# Patient Record
Sex: Female | Born: 1950 | Race: White | Hispanic: No | Smoking: Never smoker
Health system: Southern US, Community
[De-identification: ages and names within clinical notes are randomized; demographics above are authoritative.]

## PROBLEM LIST (undated history)

## (undated) DIAGNOSIS — M2550 Pain in unspecified joint: Secondary | ICD-10-CM

## (undated) DIAGNOSIS — K859 Acute pancreatitis without necrosis or infection, unspecified: Secondary | ICD-10-CM

## (undated) DIAGNOSIS — M549 Dorsalgia, unspecified: Secondary | ICD-10-CM

## (undated) DIAGNOSIS — T783XXA Angioneurotic edema, initial encounter: Secondary | ICD-10-CM

## (undated) DIAGNOSIS — M5124 Other intervertebral disc displacement, thoracic region: Secondary | ICD-10-CM

## (undated) DIAGNOSIS — L509 Urticaria, unspecified: Secondary | ICD-10-CM

## (undated) DIAGNOSIS — M4800 Spinal stenosis, site unspecified: Secondary | ICD-10-CM

## (undated) DIAGNOSIS — Z91018 Allergy to other foods: Secondary | ICD-10-CM

## (undated) DIAGNOSIS — G589 Mononeuropathy, unspecified: Secondary | ICD-10-CM

## (undated) DIAGNOSIS — K219 Gastro-esophageal reflux disease without esophagitis: Secondary | ICD-10-CM

## (undated) DIAGNOSIS — N399 Disorder of urinary system, unspecified: Secondary | ICD-10-CM

## (undated) DIAGNOSIS — R000 Tachycardia, unspecified: Secondary | ICD-10-CM

## (undated) DIAGNOSIS — K589 Irritable bowel syndrome without diarrhea: Secondary | ICD-10-CM

## (undated) DIAGNOSIS — K449 Diaphragmatic hernia without obstruction or gangrene: Secondary | ICD-10-CM

## (undated) DIAGNOSIS — G4730 Sleep apnea, unspecified: Secondary | ICD-10-CM

## (undated) DIAGNOSIS — I10 Essential (primary) hypertension: Secondary | ICD-10-CM

## (undated) DIAGNOSIS — R1310 Dysphagia, unspecified: Secondary | ICD-10-CM

## (undated) DIAGNOSIS — K635 Polyp of colon: Secondary | ICD-10-CM

## (undated) DIAGNOSIS — K5900 Constipation, unspecified: Secondary | ICD-10-CM

## (undated) DIAGNOSIS — E739 Lactose intolerance, unspecified: Secondary | ICD-10-CM

## (undated) DIAGNOSIS — Z9889 Other specified postprocedural states: Secondary | ICD-10-CM

## (undated) DIAGNOSIS — E559 Vitamin D deficiency, unspecified: Secondary | ICD-10-CM

## (undated) DIAGNOSIS — R112 Nausea with vomiting, unspecified: Secondary | ICD-10-CM

## (undated) DIAGNOSIS — N200 Calculus of kidney: Secondary | ICD-10-CM

## (undated) DIAGNOSIS — J45909 Unspecified asthma, uncomplicated: Secondary | ICD-10-CM

## (undated) HISTORY — PX: NASAL SINUS SURGERY: SHX719

## (undated) HISTORY — DX: Diaphragmatic hernia without obstruction or gangrene: K44.9

## (undated) HISTORY — DX: Polyp of colon: K63.5

## (undated) HISTORY — DX: Dorsalgia, unspecified: M54.9

## (undated) HISTORY — PX: FOOT SURGERY: SHX648

## (undated) HISTORY — DX: Tachycardia, unspecified: R00.0

## (undated) HISTORY — DX: Unspecified asthma, uncomplicated: J45.909

## (undated) HISTORY — DX: Vitamin D deficiency, unspecified: E55.9

## (undated) HISTORY — DX: Irritable bowel syndrome, unspecified: K58.9

## (undated) HISTORY — DX: Other intervertebral disc displacement, thoracic region: M51.24

## (undated) HISTORY — PX: ROTATOR CUFF REPAIR: SHX139

## (undated) HISTORY — DX: Essential (primary) hypertension: I10

## (undated) HISTORY — DX: Angioneurotic edema, initial encounter: T78.3XXA

## (undated) HISTORY — DX: Urticaria, unspecified: L50.9

## (undated) HISTORY — PX: ABDOMINAL HYSTERECTOMY: SHX81

## (undated) HISTORY — DX: Gastro-esophageal reflux disease without esophagitis: K21.9

## (undated) HISTORY — DX: Disorder of urinary system, unspecified: N39.9

## (undated) HISTORY — DX: Mononeuropathy, unspecified: G58.9

## (undated) HISTORY — DX: Pain in unspecified joint: M25.50

## (undated) HISTORY — DX: Dysphagia, unspecified: R13.10

## (undated) HISTORY — DX: Lactose intolerance, unspecified: E73.9

## (undated) HISTORY — PX: CYST REMOVAL HAND: SHX6279

## (undated) HISTORY — DX: Spinal stenosis, site unspecified: M48.00

## (undated) HISTORY — DX: Allergy to other foods: Z91.018

---

## 1977-03-17 HISTORY — PX: MANDIBLE FRACTURE SURGERY: SHX706

## 1996-03-17 HISTORY — PX: ABDOMINAL HYSTERECTOMY: SHX81

## 1996-03-17 HISTORY — PX: BLADDER SURGERY: SHX569

## 1997-08-08 ENCOUNTER — Ambulatory Visit (HOSPITAL_BASED_OUTPATIENT_CLINIC_OR_DEPARTMENT_OTHER): Admission: RE | Admit: 1997-08-08 | Discharge: 1997-08-08 | Payer: Self-pay | Admitting: Orthopedic Surgery

## 1998-01-31 ENCOUNTER — Encounter: Payer: Self-pay | Admitting: Gynecology

## 1998-01-31 ENCOUNTER — Ambulatory Visit (HOSPITAL_COMMUNITY): Admission: RE | Admit: 1998-01-31 | Discharge: 1998-01-31 | Payer: Self-pay | Admitting: Gynecology

## 1999-02-06 ENCOUNTER — Ambulatory Visit (HOSPITAL_COMMUNITY): Admission: RE | Admit: 1999-02-06 | Discharge: 1999-02-06 | Payer: Self-pay | Admitting: Psychiatry

## 1999-02-06 ENCOUNTER — Encounter: Payer: Self-pay | Admitting: Family Medicine

## 2000-02-11 ENCOUNTER — Encounter: Payer: Self-pay | Admitting: Family Medicine

## 2000-02-11 ENCOUNTER — Ambulatory Visit (HOSPITAL_COMMUNITY): Admission: RE | Admit: 2000-02-11 | Discharge: 2000-02-11 | Payer: Self-pay | Admitting: Family Medicine

## 2000-04-22 ENCOUNTER — Encounter: Payer: Self-pay | Admitting: Family Medicine

## 2000-04-22 ENCOUNTER — Encounter: Admission: RE | Admit: 2000-04-22 | Discharge: 2000-04-22 | Payer: Self-pay | Admitting: Family Medicine

## 2000-06-08 ENCOUNTER — Ambulatory Visit (HOSPITAL_COMMUNITY): Admission: RE | Admit: 2000-06-08 | Discharge: 2000-06-08 | Payer: Self-pay | Admitting: Gastroenterology

## 2000-06-08 ENCOUNTER — Encounter: Payer: Self-pay | Admitting: Gastroenterology

## 2000-06-09 ENCOUNTER — Encounter: Payer: Self-pay | Admitting: Gastroenterology

## 2000-06-09 ENCOUNTER — Ambulatory Visit (HOSPITAL_COMMUNITY): Admission: RE | Admit: 2000-06-09 | Discharge: 2000-06-09 | Payer: Self-pay | Admitting: Gastroenterology

## 2000-08-27 ENCOUNTER — Emergency Department (HOSPITAL_COMMUNITY): Admission: EM | Admit: 2000-08-27 | Discharge: 2000-08-28 | Payer: Self-pay | Admitting: Emergency Medicine

## 2001-05-20 ENCOUNTER — Other Ambulatory Visit: Admission: RE | Admit: 2001-05-20 | Discharge: 2001-05-20 | Payer: Self-pay | Admitting: Obstetrics and Gynecology

## 2001-06-11 ENCOUNTER — Ambulatory Visit (HOSPITAL_COMMUNITY): Admission: RE | Admit: 2001-06-11 | Discharge: 2001-06-11 | Payer: Self-pay | Admitting: Obstetrics and Gynecology

## 2001-06-11 ENCOUNTER — Encounter: Payer: Self-pay | Admitting: Obstetrics and Gynecology

## 2002-05-26 ENCOUNTER — Other Ambulatory Visit: Admission: RE | Admit: 2002-05-26 | Discharge: 2002-05-26 | Payer: Self-pay | Admitting: Obstetrics and Gynecology

## 2002-06-14 ENCOUNTER — Encounter: Payer: Self-pay | Admitting: Obstetrics and Gynecology

## 2002-06-14 ENCOUNTER — Ambulatory Visit (HOSPITAL_COMMUNITY): Admission: RE | Admit: 2002-06-14 | Discharge: 2002-06-14 | Payer: Self-pay | Admitting: Obstetrics and Gynecology

## 2002-07-22 ENCOUNTER — Encounter: Payer: Self-pay | Admitting: Family Medicine

## 2002-07-22 ENCOUNTER — Encounter: Admission: RE | Admit: 2002-07-22 | Discharge: 2002-07-22 | Payer: Self-pay | Admitting: Family Medicine

## 2003-03-24 ENCOUNTER — Encounter: Admission: RE | Admit: 2003-03-24 | Discharge: 2003-03-24 | Payer: Self-pay | Admitting: Family Medicine

## 2003-07-11 ENCOUNTER — Ambulatory Visit (HOSPITAL_COMMUNITY): Admission: RE | Admit: 2003-07-11 | Discharge: 2003-07-11 | Payer: Self-pay | Admitting: Obstetrics and Gynecology

## 2003-07-25 ENCOUNTER — Emergency Department (HOSPITAL_COMMUNITY): Admission: EM | Admit: 2003-07-25 | Discharge: 2003-07-25 | Payer: Self-pay | Admitting: Family Medicine

## 2003-08-02 ENCOUNTER — Encounter: Admission: RE | Admit: 2003-08-02 | Discharge: 2003-08-02 | Payer: Self-pay | Admitting: Allergy and Immunology

## 2004-08-08 ENCOUNTER — Ambulatory Visit (HOSPITAL_COMMUNITY): Admission: RE | Admit: 2004-08-08 | Discharge: 2004-08-08 | Payer: Self-pay | Admitting: Obstetrics and Gynecology

## 2005-04-21 ENCOUNTER — Emergency Department (HOSPITAL_COMMUNITY): Admission: EM | Admit: 2005-04-21 | Discharge: 2005-04-21 | Payer: Self-pay | Admitting: Family Medicine

## 2005-05-05 ENCOUNTER — Emergency Department (HOSPITAL_COMMUNITY): Admission: EM | Admit: 2005-05-05 | Discharge: 2005-05-05 | Payer: Self-pay | Admitting: Family Medicine

## 2006-01-05 ENCOUNTER — Ambulatory Visit (HOSPITAL_COMMUNITY): Admission: RE | Admit: 2006-01-05 | Discharge: 2006-01-05 | Payer: Self-pay | Admitting: Internal Medicine

## 2006-08-04 ENCOUNTER — Ambulatory Visit (HOSPITAL_COMMUNITY): Admission: RE | Admit: 2006-08-04 | Discharge: 2006-08-04 | Payer: Self-pay | Admitting: *Deleted

## 2006-08-04 ENCOUNTER — Encounter (INDEPENDENT_AMBULATORY_CARE_PROVIDER_SITE_OTHER): Payer: Self-pay | Admitting: *Deleted

## 2006-09-02 ENCOUNTER — Encounter: Admission: RE | Admit: 2006-09-02 | Discharge: 2006-09-02 | Payer: Self-pay | Admitting: *Deleted

## 2007-01-07 ENCOUNTER — Ambulatory Visit (HOSPITAL_COMMUNITY): Admission: RE | Admit: 2007-01-07 | Discharge: 2007-01-07 | Payer: Self-pay | Admitting: Internal Medicine

## 2007-06-09 ENCOUNTER — Ambulatory Visit (HOSPITAL_COMMUNITY): Admission: RE | Admit: 2007-06-09 | Discharge: 2007-06-09 | Payer: Self-pay | Admitting: Internal Medicine

## 2008-02-28 ENCOUNTER — Ambulatory Visit (HOSPITAL_COMMUNITY): Admission: RE | Admit: 2008-02-28 | Discharge: 2008-02-28 | Payer: Self-pay | Admitting: Internal Medicine

## 2008-07-10 ENCOUNTER — Encounter (INDEPENDENT_AMBULATORY_CARE_PROVIDER_SITE_OTHER): Payer: Self-pay | Admitting: *Deleted

## 2008-07-10 IMAGING — CT CT ABDOMEN WO/W CM
3 of 6 series · 15 of 46 positions shown, 17 images · IV contrast (READICAT/WATER)
Comparison: none

CLINICAL DATA: Abdominal and pelvic pain. 
 ABDOMEN CT WITHOUT AND WITH CONTRAST:
TECHNIQUE: Multidetector CT imaging of the abdomen was performed both before and during bolus administration of intravenous contrast.
 Contrast:  125 cc Omnipaque 300
 No comparison.
TECHNIQUE: Multidetector CT imaging of the pelvis was performed both before and during bolus administration of intravenous contrast.

[Series 4: routine abdomen · axial · 0.74mm/px · z∈[-426,-66]mm · 10 of 88 slices shown, 12 images]
[im 8/88  soft-tissue]
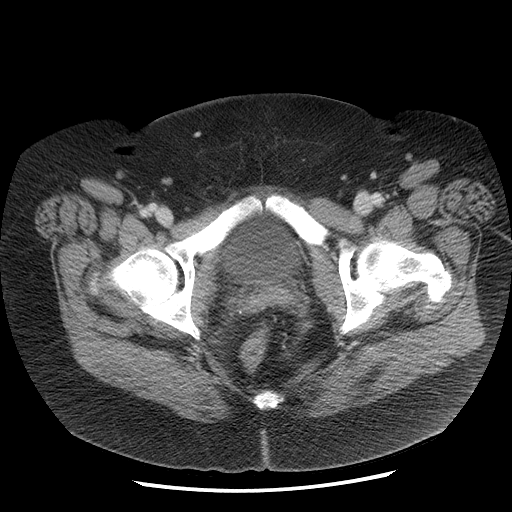
[im 8/88  bone]
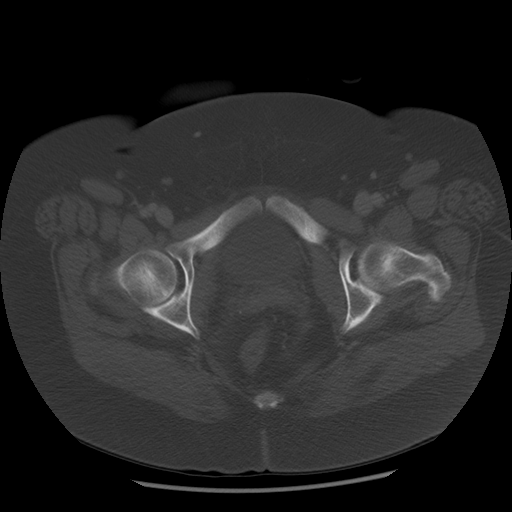
[im 16/88  soft-tissue]
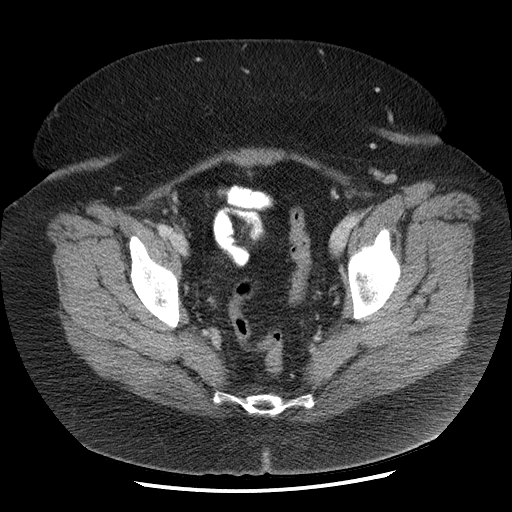
[im 24/88  soft-tissue]
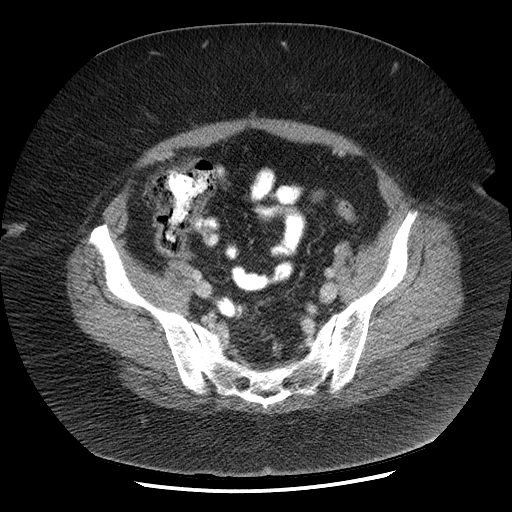
[im 32/88  soft-tissue]
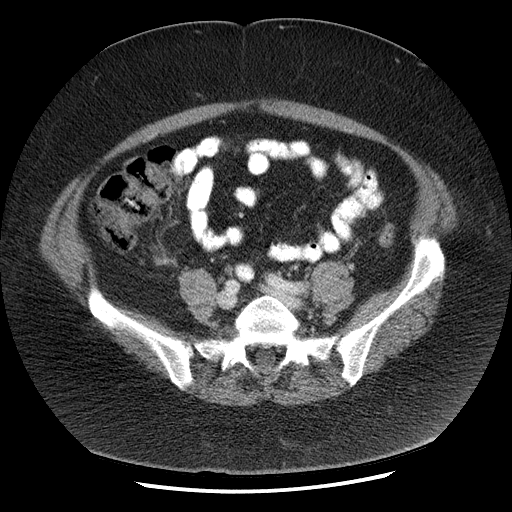
[im 40/88  soft-tissue]
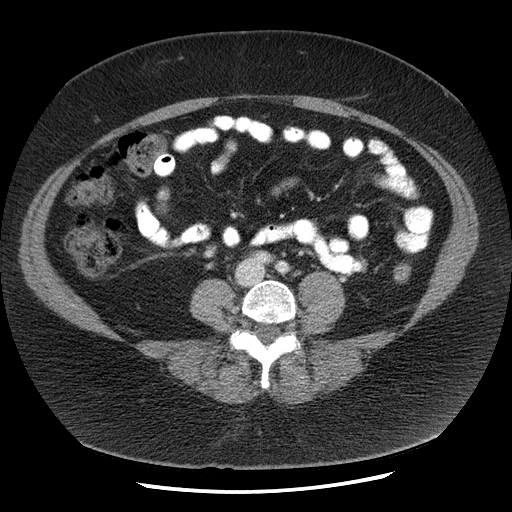
[im 48/88  soft-tissue]
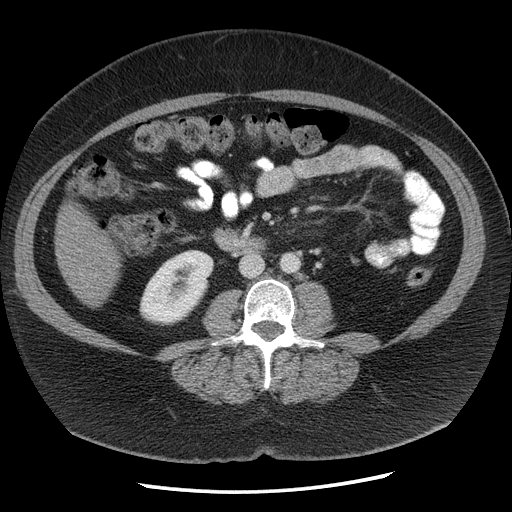
[im 56/88  soft-tissue]
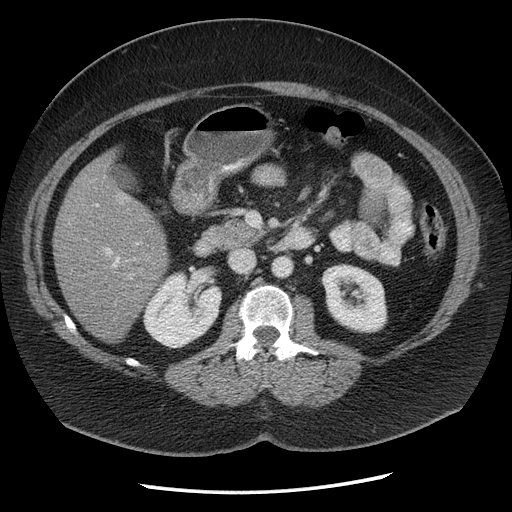
[im 64/88  soft-tissue]
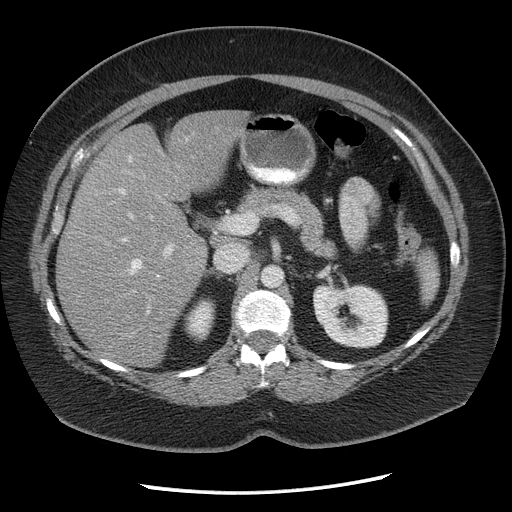
[im 72/88  soft-tissue]
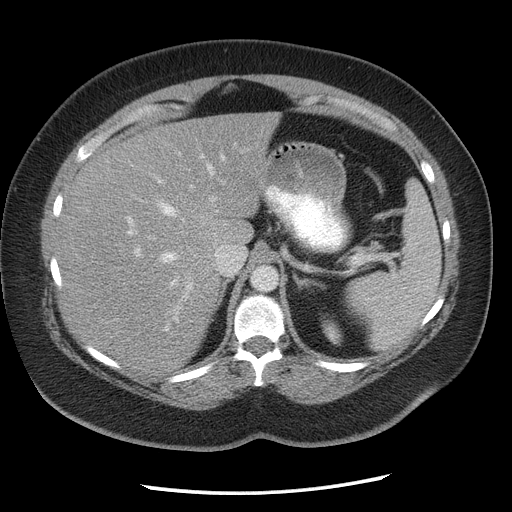
[im 72/88  bone]
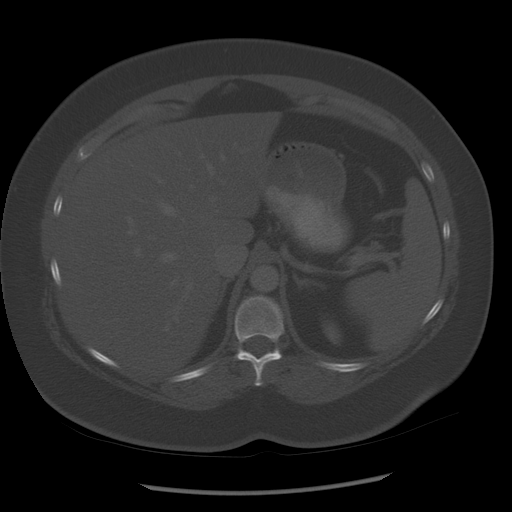
[im 80/88  soft-tissue]
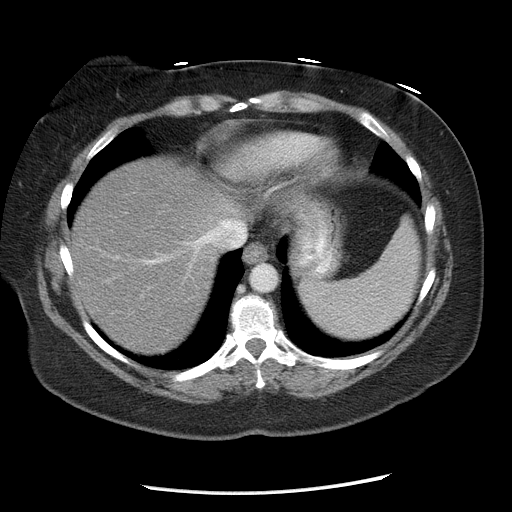

[Series 5: lung windows · axial · 0.66mm/px · z∈[-116,-71]mm · 2 of 27 slices shown]
[im 9/27  bone]
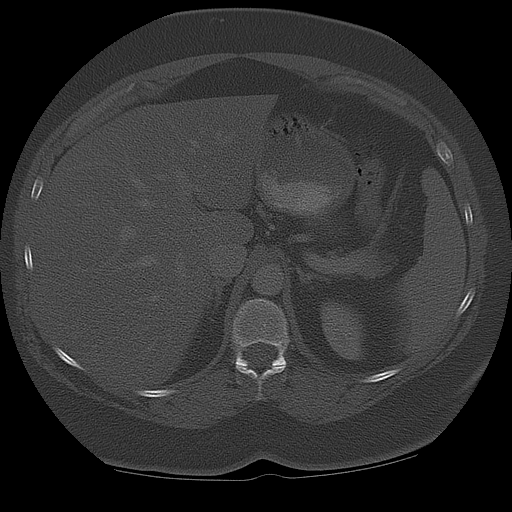
[im 18/27  bone]
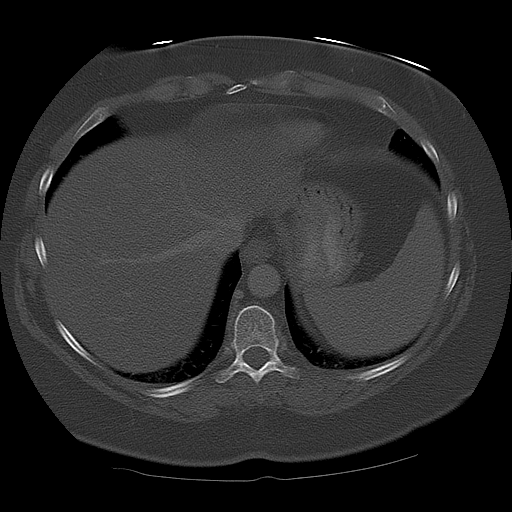

[Series 602: sagittal body · sagittal · 0.89mm/px · 3 of 157 slices shown]
[im 53/157  soft-tissue]
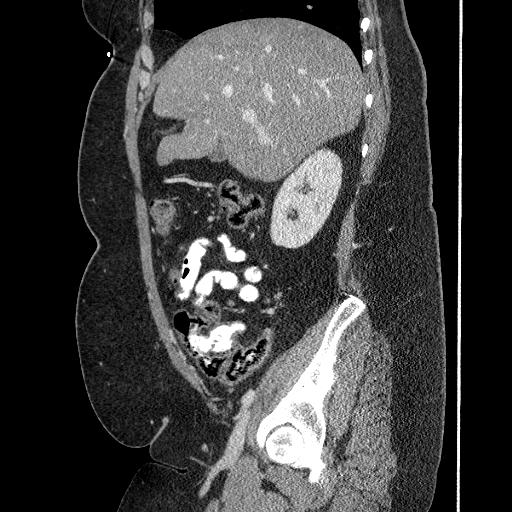
[im 70/157  soft-tissue]
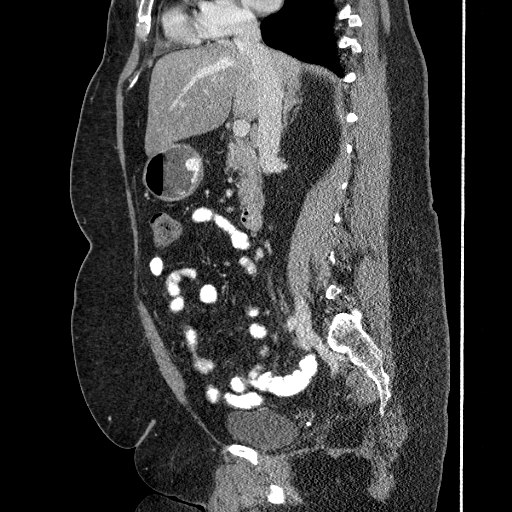
[im 87/157  soft-tissue]
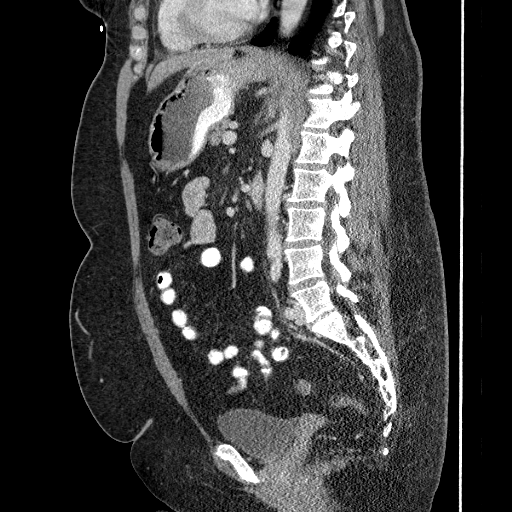

[15 of 46 positions shown; findings below may reference images not displayed]

FINDINGS: The lung bases are clear.  The unenhanced study shows no renal calculi.  No calcified gallstones are noted.  After contrast administration, the liver enhances with no focal abnormality.  The liver is somewhat low in attenuation and mild fatty infiltration is a consideration.  No ductal dilatation is seen.  The gallbladder is well-visualized and no gallstones are noted.  The pancreas is normal in size and the pancreatic duct is not dilated.  The adrenal glands and spleen appear normal.  The kidneys enhance and on delayed images, the pelvocaliceal systems appear normal.  The abdominal aorta is normal in caliber.
IMPRESSION: No acute abnormality on CT of the abdomen.  There is suggestion of diffuse fatty infiltration of the liver. 
 PELVIS CT WITH CONTRAST:
FINDINGS: The ureters are normal in caliber on the unenhanced study, and no distal ureteral calculi are seen.  The urinary bladder is unremarkable.  The patient has previously undergone hysterectomy.  No adnexal lesion is seen.  The terminal ileum appears normal.   No fluid is seen within the pelvis.
IMPRESSION: Negative CT of the pelvis.

## 2009-05-22 ENCOUNTER — Ambulatory Visit (HOSPITAL_COMMUNITY): Admission: RE | Admit: 2009-05-22 | Discharge: 2009-05-22 | Payer: Self-pay | Admitting: Internal Medicine

## 2010-05-27 ENCOUNTER — Other Ambulatory Visit (HOSPITAL_COMMUNITY): Payer: Self-pay | Admitting: Internal Medicine

## 2010-05-27 ENCOUNTER — Ambulatory Visit (HOSPITAL_COMMUNITY)
Admission: RE | Admit: 2010-05-27 | Discharge: 2010-05-27 | Disposition: A | Discharge status: Home / Self Care | Payer: BC Managed Care – PPO | Source: Ambulatory Visit | Attending: Internal Medicine | Admitting: Internal Medicine

## 2010-05-27 DIAGNOSIS — Z1231 Encounter for screening mammogram for malignant neoplasm of breast: Secondary | ICD-10-CM

## 2010-07-30 NOTE — Op Note (Signed)
[name], Anna Francis           ACCOUNT NO.:  [id number]   MEDICAL RECORD NO.:  [id number]          PATIENT TYPE:  AMB   LOCATION:  ENDO                         FACILITY:  [facility]   PHYSICIAN:  Georgiana Spinner, M.D.    DATE OF BIRTH:  May 25, 1950   DATE OF PROCEDURE:  08/04/2006  DATE OF DISCHARGE:                               OPERATIVE REPORT   PROCEDURE:  Colonoscopy.   INDICATIONS:  Colon polyp, colon cancer screening and abdominal pain.   ANESTHESIA:  Demerol 60 mg, Versed 4 mg.   PROCEDURE:  With the patient mildly sedated in the left lateral  decubitus position, the Pentax videoscopic colonoscope was inserted in  the rectum and passed under direct vision to the sigmoid colon, and I  could not get to go past an area that appeared somewhat tight at that  point, so I elected to withdraw the colonoscope and insert instead the  Pentax videoscopic pediatric colonoscope.  With pressure applied and the  patient turned subsequently to her back, we were able to advance the  endoscope to the cecum identified by ileocecal valve and base of cecum,  both of which were photographed.  From this point, the colonoscope was  slowly withdrawn, taking circumferential views of colonic mucosa,  stopping only at 25 cm from anal verge at which point a polyp was seen,  photographed and removed using hot biopsy forceps technique - setting of  20/200 blended current.  We pulled back to the rectum which appeared  normal on direct and showed hemorrhoids on retroflexed view.  The  endoscope was straightened and withdrawn.  The patient's vital signs and  pulse oximeter remained stable.  The patient tolerated the procedure  well without apparent complications.   FINDINGS:  Internal hemorrhoids.  Polyp at 25 cm from anal verge with  some tightening of either a stricture formation or a turning of the  colon which would preclude passage of a regular colonoscope - this in  the sigmoid colon area.   PLAN:  The  patient will call me for results of biopsy and follow-up with  me as an outpatient.           ______________________________  Georgiana Spinner, M.D.     GMO/MEDQ  D:  08/04/2006  T:  08/04/2006  Job:  [id number]

## 2010-07-30 NOTE — Op Note (Signed)
NAMESARABI, SOCKWELL           ACCOUNT NO.:  [id number]   MEDICAL RECORD NO.:  [id number]          PATIENT TYPE:  AMB   LOCATION:  ENDO                         FACILITY:  [facility]   PHYSICIAN:  Georgiana Spinner, M.D.    DATE OF BIRTH:  25-Nov-1950   DATE OF PROCEDURE:  DATE OF DISCHARGE:                               OPERATIVE REPORT   PROCEDURE:  Upper endoscopy.   INDICATIONS:  Abdominal pain.   ANESTHESIA:  Demerol 40 mg, Versed 5 mg.   PROCEDURE:  With the patient mildly sedated in the left lateral  decubitus position, the Pentax videoscopic endoscope was inserted in the  mouth and passed under direct vision through the esophagus which  appeared normal until we reached distal esophagus, and there were  questionable changes of Barrett's photographed and biopsied.  We then  entered into the stomach.  The fundus, body, antrum, duodenal bulb, and  second portion of the duodenum appeared normal.  From this point the  endoscope was slowly withdrawn, taking circumferential views of the  duodenal mucosa until the endoscope had been pulled back into the  stomach, placed in retroflexion to view the stomach from below.  The  endoscope was straightened and withdrawn, taking circumferential views  of the remaining gastric and esophageal mucosa after biopsying a fundic  gland polyp.  The patient's vital signs and pulse oximeter remained  stable.  The patient tolerated the procedure well without apparent  complications.   FINDINGS:  Polyp in the fundus, probably a fundic gland polyp.  Question  of Barrett's esophagus, biopsied.  Await biopsy report.  The patient  will call me for results and follow up with me as an outpatient.  Proceed to colonoscopy as planned.           ______________________________  Georgiana Spinner, M.D.     GMO/MEDQ  D:  08/04/2006  T:  08/04/2006  Job:  [id number]

## 2011-05-16 ENCOUNTER — Other Ambulatory Visit (HOSPITAL_COMMUNITY): Payer: Self-pay | Admitting: Internal Medicine

## 2011-05-16 DIAGNOSIS — Z1231 Encounter for screening mammogram for malignant neoplasm of breast: Secondary | ICD-10-CM

## 2011-05-19 ENCOUNTER — Emergency Department (HOSPITAL_COMMUNITY)
Admission: EM | Admit: 2011-05-19 | Discharge: 2011-05-19 | Disposition: A | Discharge status: Home / Self Care | Payer: BC Managed Care – PPO | Attending: Emergency Medicine | Admitting: Emergency Medicine

## 2011-05-19 ENCOUNTER — Emergency Department (INDEPENDENT_AMBULATORY_CARE_PROVIDER_SITE_OTHER)
Admission: EM | Admit: 2011-05-19 | Discharge: 2011-05-19 | Disposition: A | Discharge status: Nursing Home (Includes ICF) | Payer: BC Managed Care – PPO | Source: Home / Self Care

## 2011-05-19 ENCOUNTER — Emergency Department (HOSPITAL_COMMUNITY): Payer: BC Managed Care – PPO

## 2011-05-19 ENCOUNTER — Emergency Department (INDEPENDENT_AMBULATORY_CARE_PROVIDER_SITE_OTHER): Payer: BC Managed Care – PPO

## 2011-05-19 ENCOUNTER — Encounter (HOSPITAL_COMMUNITY): Payer: Self-pay | Admitting: Emergency Medicine

## 2011-05-19 ENCOUNTER — Encounter (HOSPITAL_COMMUNITY): Payer: Self-pay

## 2011-05-19 DIAGNOSIS — R109 Unspecified abdominal pain: Secondary | ICD-10-CM

## 2011-05-19 DIAGNOSIS — K219 Gastro-esophageal reflux disease without esophagitis: Secondary | ICD-10-CM | POA: Insufficient documentation

## 2011-05-19 DIAGNOSIS — R3129 Other microscopic hematuria: Secondary | ICD-10-CM

## 2011-05-19 DIAGNOSIS — R1012 Left upper quadrant pain: Secondary | ICD-10-CM | POA: Insufficient documentation

## 2011-05-19 HISTORY — DX: Calculus of kidney: N20.0

## 2011-05-19 HISTORY — DX: Gastro-esophageal reflux disease without esophagitis: K21.9

## 2011-05-19 LAB — DIFFERENTIAL
Basophils Absolute: 0 10*3/uL (ref 0.0–0.1)
Eosinophils Relative: 2 % (ref 0–5)
Lymphocytes Relative: 35 % (ref 12–46)
Lymphs Abs: 3.2 10*3/uL (ref 0.7–4.0)
Monocytes Absolute: 0.6 10*3/uL (ref 0.1–1.0)
Monocytes Relative: 7 % (ref 3–12)
Neutro Abs: 5.3 10*3/uL (ref 1.7–7.7)

## 2011-05-19 LAB — POCT URINALYSIS DIP (DEVICE)
Glucose, UA: NEGATIVE mg/dL
Ketones, ur: NEGATIVE mg/dL
Specific Gravity, Urine: <=1.005 (ref 1.005–1.030)
Urobilinogen, UA: 0.2 mg/dL (ref 0.0–1.0)

## 2011-05-19 LAB — COMPREHENSIVE METABOLIC PANEL
AST: 31 U/L (ref 0–37)
CO2: 29 mEq/L (ref 19–32)
Calcium: 10.1 mg/dL (ref 8.4–10.5)
Chloride: 101 mEq/L (ref 96–112)
Creatinine, Ser: 0.73 mg/dL (ref 0.50–1.10)
GFR calc Af Amer: >90 mL/min (ref >90)
GFR calc non Af Amer: >90 mL/min (ref >90)
Glucose, Bld: 92 mg/dL (ref 70–99)
Total Bilirubin: 0.5 mg/dL (ref 0.3–1.2)

## 2011-05-19 LAB — LIPASE, BLOOD: Lipase: 37 U/L (ref 11–59)

## 2011-05-19 LAB — CBC
HCT: 40.8 % (ref 36.0–46.0)
Hemoglobin: 13.7 g/dL (ref 12.0–15.0)
MCV: 90.3 fL (ref 78.0–100.0)
RBC: 4.52 MIL/uL (ref 3.87–5.11)
RDW: 13.2 % (ref 11.5–15.5)
WBC: 9.4 10*3/uL (ref 4.0–10.5)

## 2011-05-19 LAB — URINALYSIS, ROUTINE W REFLEX MICROSCOPIC
Glucose, UA: NEGATIVE mg/dL
Leukocytes, UA: NEGATIVE
Protein, ur: NEGATIVE mg/dL
pH: 5.5 (ref 5.0–8.0)

## 2011-05-19 LAB — URINE MICROSCOPIC-ADD ON

## 2011-05-19 MED ORDER — HYDROCODONE-ACETAMINOPHEN 5-325 MG PO TABS: 1.0000 | ORAL_TABLET | Freq: Four times a day (QID) | ORAL | Status: AC | PRN

## 2011-05-19 MED ORDER — ACETAMINOPHEN 325 MG PO TABS
650.0000 mg | ORAL_TABLET | Freq: Once | ORAL | Status: AC
  Administered 2011-05-19: 650 mg via ORAL
  Filled 2011-05-19: qty 2

## 2011-05-19 NOTE — ED Notes (Signed)
Pt alert, NAD, calm, interactive, skin W&D, resps e/u, rates pain 810, "feels about the same, to CT.

## 2011-05-19 NOTE — ED Provider Notes (Signed)
History     CSN: [id number]  Arrival date & time 05/19/11  1056   None     Chief Complaint  Patient presents with  . Flank Pain  . Rash    (Consider location/radiation/quality/duration/timing/severity/associated sxs/prior treatment) HPI Comments: Jania presents today with complaints of left flank pain. Pain began yesterday and she states that it at times is severe, but does not radiate.  Her bowel movements have been normal, been more frequent recently due to any change in her diet. She did try a suppository yesterday thinking that she had a bowel movement it may help, but did not have any improvement after passing only mucus. No fever, chills, nausea or vomiting. She has been urinating normally and denies gross hematuria or dysuria. She has a hx of kidney stones many yrs ago.    Past Medical History  Diagnosis Date  . GERD (gastroesophageal reflux disease)   . Kidney stone     Past Surgical History  Procedure Date  . Abdominal hysterectomy   . Nasal sinus surgery     History reviewed. No pertinent family history.  History  Substance Use Topics  . Smoking status: Never Smoker   . Smokeless tobacco: Not on file  . Alcohol Use: No    OB History    Grav Para Term Preterm Abortions TAB SAB Ect Mult Living                  Review of Systems  Constitutional: Negative for fever and chills.  Gastrointestinal: Negative for nausea, vomiting, abdominal pain, diarrhea, constipation and abdominal distention.  Genitourinary: Positive for flank pain. Negative for dysuria, hematuria and decreased urine volume.  Skin: Positive for rash.    Allergies  Lorabid and Penicillins  Home Medications   Current Outpatient Rx  Name Route Sig Dispense Refill  . FLONASE NA Nasal Place into the nose.    Marland Kitchen PREVACID PO Oral Take by mouth.    . CENTRUM SILVER PO Oral Take by mouth.      BP 147/94  Pulse 76  Temp(Src) 97.8 F (36.6 C) (Oral)  Resp 16  SpO2 100%  Physical Exam    Nursing note and vitals reviewed. Constitutional: She appears well-developed and well-nourished. No distress.  HENT:  Head: Normocephalic and atraumatic.  Cardiovascular: Normal rate, regular rhythm and normal heart sounds.   Pulmonary/Chest: Effort normal and breath sounds normal. No respiratory distress.  Abdominal: Soft. Bowel sounds are normal. She exhibits no mass. There is no hepatosplenomegaly. There is tenderness in the left upper quadrant. There is no guarding.  Musculoskeletal:       Thoracic back: Normal.       Lumbar back: Normal.  Skin: Skin is warm and dry. Rash noted.       Faint red macular rash noted Lt waistline - improved after pt stopped rubbing the area, but did not completely resolve. Also 2 red papules, no vesicles. The rash is not tender to light touch.   Psychiatric: She has a normal mood and affect.    ED Course  Procedures (including critical care time)  Labs Reviewed  POCT URINALYSIS DIP (DEVICE) - Abnormal; Notable for the following:    Hgb urine dipstick MODERATE (*)    All other components within normal limits   Dg Abd 1 View  05/19/2011  *RADIOLOGY REPORT*  Clinical Data: Left flank pain.  ABDOMEN - 1 VIEW  Comparison: [place] [facility] at [facility] CT abdomen and pelvis exam from 09/02/2006.  Findings: There is no evidence for gaseous bowel dilation to suggest obstruction.  A small phlebolith in the right hemi pelvis was present on the previous CT scan.  Visualized bony structures are unremarkable.  IMPRESSION: Normal bowel gas pattern.  Original Report Authenticated By: ERIC A. MANSELL, M.D.     1. Left flank pain   2. Hematuria, microscopic       MDM  Pt transferred to [facility] via shuttle - r/o renal lithiasis. Differential also includes though less likely diverticulitis, pancreatitis or early herpes zoster.         Melody Comas, [place] 05/19/11 1433

## 2011-05-19 NOTE — ED Notes (Signed)
C/o L flank pain, onset Saturday, feels crampy & spasm-like, (denies: fever, nvd, urinary or vaginal sx, weakness, incontinence, abd pain or other sx). EDPA into see pt.

## 2011-05-19 NOTE — ED Notes (Signed)
PT. REPORTS LEFT FLANK PAIN ONSET LAST Saturday , DENIES HEMATURIA OR DYSURIA , NO FEVER OR CHILLS , NO VAGINAL DISCHARGE. DENIES INJURY OR FALL.

## 2011-05-19 NOTE — Discharge Instructions (Signed)
You were seen and evaluated today for your symptoms of left flank and back pains. At this time your lab tests and CAT scan had not shown any signs for concerning or emergent cause to her symptoms. There were no signs for kidney stones. There were no signs for any infection. At this time your providers feel you're able to return home with symptomatic treatment of your pain is with pain medication. Please call your primary care provider for continued evaluation and treatment of your symptoms. He develop any worsening symptoms, chest pain, shortness of breath, fever, chills, persistent nausea vomiting please return to the emergency room.  Abdominal Pain Abdominal pain can be caused by many things. Your caregiver decides the seriousness of your pain by an examination and possibly blood tests and X-rays. Many cases can be observed and treated at home. Most abdominal pain is not caused by a disease and will probably improve without treatment. However, in many cases, more time must pass before a clear cause of the pain can be found. Before that point, it may not be known if you need more testing, or if hospitalization or surgery is needed. HOME CARE INSTRUCTIONS   Do not take laxatives unless directed by your caregiver.   Take pain medicine only as directed by your caregiver.   Only take over-the-counter or prescription medicines for pain, discomfort, or fever as directed by your caregiver.   Try a clear liquid diet (broth, tea, or water) for as long as directed by your caregiver. Slowly move to a bland diet as tolerated.  SEEK IMMEDIATE MEDICAL CARE IF:   The pain does not go away.   You have a fever.   You keep throwing up (vomiting).   The pain is felt only in portions of the abdomen. Pain in the right side could possibly be appendicitis. In an adult, pain in the left lower portion of the abdomen could be colitis or diverticulitis.   You pass bloody or black tarry stools.  MAKE SURE YOU:    Understand these instructions.   Will watch your condition.   Will get help right away if you are not doing well or get worse.  Document Released: 12/11/2004 Document Revised: 02/20/2011 Document Reviewed: 10/20/2007 [facility] Patient Information 2012 [place], [place].     Flank Pain Flank pain refers to pain that is located on the side of the body between the upper abdomen and the back. It can be caused by many things. CAUSES  Some of the more common causes of flank pain include:  Muscle strain.   Muscle spasms.   A disease of your spine (vertebral disk disease).   A lung infection (pneumonia).   Fluid around your lungs (pulmonary edema).   A kidney infection.   Kidney stones.   A very painful skin rash on only one side of your body (shingles).   Gallbladder disease.  DIAGNOSIS  Blood tests, urine tests, and X-rays may help your caregiver determine what is wrong. TREATMENT  The treatment of pain depends on the cause. Your caregiver will determine what treatment will work best for you. HOME CARE INSTRUCTIONS   Home care will depend on the cause of your pain.   Some medications may help relieve the pain. Take medication for relief of pain as directed by your caregiver.   Tell your caregiver about any changes in your pain.   Follow up with your caregiver.  SEEK IMMEDIATE MEDICAL CARE IF:   Your pain is not controlled with medication.  The pain increases.   You have abdominal pain.   You have shortness of breath.   You have persistent nausea or vomiting.   You have swelling in your abdomen.   You feel faint or pass out.   You have a temperature by mouth above 102 F (38.9 C), not controlled by medicine.  MAKE SURE YOU:   Understand these instructions.   Will watch your condition.   Will get help right away if you are not doing well or get worse.  Document Released: 04/24/2005 Document Revised: 02/20/2011 Document Reviewed: 08/18/2009 [facility]  Patient Information 2012 [place], [place].

## 2011-05-19 NOTE — ED Notes (Signed)
Pt c/o not feeling well since Thurs ( no energy), reports lt flank pain since yesterday.  States today the lt flank area is itching.  Pt has hx of kidney stones but denies urinary sx, fever or other sx.

## 2011-05-19 NOTE — ED Provider Notes (Signed)
History     CSN: [id number]  Arrival date & time 05/19/11  1459   First MD Initiated Contact with Patient 05/19/11 2001      Chief Complaint  Patient presents with  . Flank Pain     HPI  History provided by the patient. Patient is a 61 year old female with past history of acid reflux and kidney stones who presents from urgent care Center complains of left flank pain the past 2 days. Symptoms began Saturday morning and gradually increased throughout the day. Pain is described as a sharp aching pain. Pain is rated as moderate to severe at times. Patient was told urgent care Center she had some blood and urine and was sent here for further workup and to rule out possible kidney stone. Patient has used warm soaks at home with no significant improvements. She has not used any medications for her symptoms. She denies any other aggravating or alleviating factors. Symptoms were not associated with fever, chills, sweats, nausea, vomiting, diarrhea or constipation. Patient also denies any dysuria, urinary frequency, hematuria, vaginal discharge or vaginal bleeding. Patient does report having recent dietary change. She states she is eliminated several foods that she normally eats from her diet as part of this plan. Patient states she's not sure if this is related to her symptoms. patient denies any heavy or significant alcohol use. Patient has no history of pancreatitis. Patient denies any chest pain, shortness of breath or pleuritic pains.      Past Medical History  Diagnosis Date  . GERD (gastroesophageal reflux disease)   . Kidney stone   . Kidney stones   . GERD (gastroesophageal reflux disease)     Past Surgical History  Procedure Date  . Abdominal hysterectomy   . Nasal sinus surgery     No family history on file.  History  Substance Use Topics  . Smoking status: Never Smoker   . Smokeless tobacco: Not on file  . Alcohol Use: No    OB History    Grav Para Term Preterm Abortions  TAB SAB Ect Mult Living                  Review of Systems  Constitutional: Negative for fever, chills, appetite change and fatigue.  Respiratory: Negative for cough and shortness of breath.   Cardiovascular: Negative for chest pain.  Gastrointestinal: Positive for nausea and abdominal pain. Negative for vomiting, diarrhea, constipation and blood in stool.  Genitourinary: Positive for flank pain. Negative for dysuria, frequency, hematuria, vaginal bleeding and vaginal discharge.  All other systems reviewed and are negative.    Allergies  Latex; Lorabid; Peanut-containing drug products; Penicillins; Pork-derived products; Raspberry; Shellfish-derived products; and Strawberry  Home Medications   Current Outpatient Rx  Name Route Sig Dispense Refill  . VITAMIN D PO Oral Take 1 capsule by mouth daily.    Marland Kitchen FLUTICASONE PROPIONATE 50 MCG/ACT NA SUSP Nasal Place 2 sprays into the nose daily.    Marland Kitchen LANSOPRAZOLE 15 MG PO CPDR Oral Take 15 mg by mouth daily.    . CENTRUM SILVER PO Oral Take 1 tablet by mouth daily.       BP 153/98  Pulse 73  Temp(Src) 96.5 F (35.8 C) (Oral)  Resp 16  SpO2 98%  Physical Exam  Nursing note and vitals reviewed. Constitutional: She is oriented to person, place, and time. She appears well-developed and well-nourished. No distress.  HENT:  Head: Normocephalic and atraumatic.  Cardiovascular: Normal rate and regular rhythm.  Pulmonary/Chest: Effort normal and breath sounds normal. No respiratory distress. She has no wheezes. She has no rales.  Abdominal: Soft. There is tenderness in the left upper quadrant. There is no rigidity, no rebound, no guarding, no CVA tenderness, no tenderness at McBurney's point and negative Murphy's sign.       Obese  Neurological: She is alert and oriented to person, place, and time.  Skin: Skin is warm and dry. No rash noted.  Psychiatric: She has a normal mood and affect. Her behavior is normal.    ED Course    Procedures   Results for orders placed during the hospital encounter of 05/19/11  URINALYSIS, ROUTINE W REFLEX MICROSCOPIC      Component Value Range   Color, Urine YELLOW  YELLOW    APPearance CLEAR  CLEAR    Specific Gravity, Urine 1.018  1.005 - 1.030    pH 5.5  5.0 - 8.0    Glucose, UA NEGATIVE  NEGATIVE (mg/dL)   Hgb urine dipstick MODERATE (*) NEGATIVE    Bilirubin Urine NEGATIVE  NEGATIVE    Ketones, ur 15 (*) NEGATIVE (mg/dL)   Protein, ur NEGATIVE  NEGATIVE (mg/dL)   Urobilinogen, UA 0.2  0.0 - 1.0 (mg/dL)   Nitrite NEGATIVE  NEGATIVE    Leukocytes, UA NEGATIVE  NEGATIVE   CBC      Component Value Range   WBC 9.4  4.0 - 10.5 (K/uL)   RBC 4.52  3.87 - 5.11 (MIL/uL)   Hemoglobin 13.7  12.0 - 15.0 (g/dL)   HCT 16.1  09.6 - 04.5 (%)   MCV 90.3  78.0 - 100.0 (fL)   MCH 30.3  26.0 - 34.0 (pg)   MCHC 33.6  30.0 - 36.0 (g/dL)   RDW 40.9  81.1 - 91.4 (%)   Platelets 298  150 - 400 (K/uL)  DIFFERENTIAL      Component Value Range   Neutrophils Relative 57  43 - 77 (%)   Neutro Abs 5.3  1.7 - 7.7 (K/uL)   Lymphocytes Relative 35  12 - 46 (%)   Lymphs Abs 3.2  0.7 - 4.0 (K/uL)   Monocytes Relative 7  3 - 12 (%)   Monocytes Absolute 0.6  0.1 - 1.0 (K/uL)   Eosinophils Relative 2  0 - 5 (%)   Eosinophils Absolute 0.2  0.0 - 0.7 (K/uL)   Basophils Relative 0  0 - 1 (%)   Basophils Absolute 0.0  0.0 - 0.1 (K/uL)  COMPREHENSIVE METABOLIC PANEL      Component Value Range   Sodium 141  135 - 145 (mEq/L)   Potassium 3.6  3.5 - 5.1 (mEq/L)   Chloride 101  96 - 112 (mEq/L)   CO2 29  19 - 32 (mEq/L)   Glucose, Bld 92  70 - 99 (mg/dL)   BUN 13  6 - 23 (mg/dL)   Creatinine, Ser 7.82  0.50 - 1.10 (mg/dL)   Calcium 95.6  8.4 - 10.5 (mg/dL)   Total Protein 7.7  6.0 - 8.3 (g/dL)   Albumin 4.2  3.5 - 5.2 (g/dL)   AST 31  0 - 37 (U/L)   ALT 46 (*) 0 - 35 (U/L)   Alkaline Phosphatase 85  39 - 117 (U/L)   Total Bilirubin 0.5  0.3 - 1.2 (mg/dL)   GFR calc non Af Amer >90  >90 (mL/min)    GFR calc Af Amer >90  >90 (mL/min)  URINE MICROSCOPIC-ADD ON  Component Value Range   Squamous Epithelial / LPF FEW (*) RARE    WBC, UA 0-2  <3 (WBC/hpf)   RBC / HPF 0-2  <3 (RBC/hpf)   Bacteria, UA RARE  RARE    Urine-Other MUCOUS PRESENT        Dg Abd 1 View  05/19/2011  *RADIOLOGY REPORT*  Clinical Data: Left flank pain.  ABDOMEN - 1 VIEW  Comparison: [place] [facility] at [facility] CT abdomen and pelvis exam from 09/02/2006.  Findings: There is no evidence for gaseous bowel dilation to suggest obstruction.  A small phlebolith in the right hemi pelvis was present on the previous CT scan.  Visualized bony structures are unremarkable.  IMPRESSION: Normal bowel gas pattern.  Original Report Authenticated By: ERIC A. MANSELL, M.D.     1. Flank pain       MDM  8:00PM Pt seen and evaluated. Patient in no acute distress.  Will order CT scan and lipase to evaluate pancreas and possibly for kidney stone. Discuss with patient options for treatment of pain. This time she would just like Tylenol. Plan to reassess and offer any additional medicines as needed.  Pt discussed with attending Physician.  He agrees with workup and plan.         Angus Seller, [place] 05/20/11 [phone]

## 2011-05-20 NOTE — ED Provider Notes (Signed)
Medical screening examination/treatment/procedure(s) were performed by non-physician practitioner and as supervising physician I was immediately available for consultation/collaboration.  Gwendolen Hewlett P Lahela Woodin, MD 05/20/11 2347 

## 2011-05-20 NOTE — ED Provider Notes (Signed)
Medical screening examination/treatment/procedure(s) were performed by non-physician practitioner and as supervising physician I was immediately available for consultation/collaboration.  Roosevelt Bisher, M.D.   Owenn Rothermel Charles Laure Leone, MD 05/20/11 0809 

## 2011-06-10 ENCOUNTER — Ambulatory Visit (HOSPITAL_COMMUNITY)
Admission: RE | Admit: 2011-06-10 | Discharge: 2011-06-10 | Disposition: A | Discharge status: Home / Self Care | Payer: BC Managed Care – PPO | Source: Ambulatory Visit | Attending: Internal Medicine | Admitting: Internal Medicine

## 2011-06-10 DIAGNOSIS — Z1231 Encounter for screening mammogram for malignant neoplasm of breast: Secondary | ICD-10-CM | POA: Insufficient documentation

## 2012-02-18 ENCOUNTER — Other Ambulatory Visit (HOSPITAL_COMMUNITY): Payer: Self-pay | Admitting: Orthopedic Surgery

## 2012-02-24 ENCOUNTER — Encounter (HOSPITAL_COMMUNITY): Payer: Self-pay | Admitting: Respiratory Therapy

## 2012-02-27 ENCOUNTER — Encounter (HOSPITAL_COMMUNITY): Payer: Self-pay

## 2012-02-27 ENCOUNTER — Encounter (HOSPITAL_COMMUNITY)
Admission: RE | Admit: 2012-02-27 | Discharge: 2012-02-27 | Disposition: A | Discharge status: Home / Self Care | Payer: BC Managed Care – PPO | Source: Ambulatory Visit | Attending: Orthopedic Surgery | Admitting: Orthopedic Surgery

## 2012-02-27 HISTORY — DX: Acute pancreatitis without necrosis or infection, unspecified: K85.90

## 2012-02-27 HISTORY — DX: Other specified postprocedural states: R11.2

## 2012-02-27 HISTORY — DX: Constipation, unspecified: K59.00

## 2012-02-27 HISTORY — DX: Sleep apnea, unspecified: G47.30

## 2012-02-27 HISTORY — DX: Other specified postprocedural states: Z98.890

## 2012-02-27 LAB — CBC
MCH: 30.4 pg (ref 26.0–34.0)
MCHC: 33.6 g/dL (ref 30.0–36.0)
MCV: 90.7 fL (ref 78.0–100.0)
Platelets: 311 10*3/uL (ref 150–400)
RDW: 13.1 % (ref 11.5–15.5)

## 2012-02-27 MED ORDER — CHLORHEXIDINE GLUCONATE 4 % EX LIQD: 60.0000 mL | Freq: Once | CUTANEOUS | Status: DC

## 2012-02-27 NOTE — Progress Notes (Signed)
Patient denied having a stress test, cardiac cath, or sleep study. Patient informed Nurse that she has sleep apnea but has not worn CPAP machine in about a year, and currently her CPAP machine is in her camper that is being worked on in [place], [place].

## 2012-02-27 NOTE — Pre-Procedure Instructions (Signed)
20 LILIANN FILE  02/27/2012   Your procedure is scheduled on:  Tuesday March 02, 2012.  Report to [facility] at 0530 AM.  Call this number if you have problems the morning of surgery: [phone]   Remember:   Do not eat food or drink:After Midnight.    Take these medicines the morning of surgery with A SIP OF WATER: NONE   Do not wear jewelry, make-up or nail polish.  Do not wear lotions, powders, or perfumes. You may NOT wear deodorant.  Do not shave 48 hours prior to surgery.   Do not bring valuables to the hospital.  Contacts, dentures or bridgework may not be worn into surgery.  Leave suitcase in the car. After surgery it may be brought to your room.  For patients admitted to the hospital, checkout time is 11:00 AM the day of discharge.   Patients discharged the day of surgery will not be allowed to drive home.  Name and phone number of your driver:   Special Instructions: Shower using CHG 2 nights before surgery and the night before surgery.  If you shower the day of surgery use CHG.  Use special wash - you have one bottle of CHG for all showers.  You should use approximately 1/3 of the bottle for each shower.   Please read over the following fact sheets that you were given: Pain Booklet, Coughing and Deep Breathing, MRSA Information and Surgical Site Infection Prevention

## 2012-03-01 MED ORDER — CLINDAMYCIN PHOSPHATE 900 MG/50ML IV SOLN
900.0000 mg | INTRAVENOUS | Status: AC
  Administered 2012-03-02: 900 mg via INTRAVENOUS
  Filled 2012-03-01: qty 50

## 2012-03-01 NOTE — H&P (Signed)
  D [id number]

## 2012-03-02 ENCOUNTER — Encounter (HOSPITAL_COMMUNITY): Payer: Self-pay | Admitting: Anesthesiology

## 2012-03-02 ENCOUNTER — Encounter (HOSPITAL_COMMUNITY)
Admission: RE | Disposition: A | Discharge status: Home / Self Care | Payer: Self-pay | Source: Ambulatory Visit | Attending: Orthopedic Surgery

## 2012-03-02 ENCOUNTER — Ambulatory Visit (HOSPITAL_COMMUNITY)
Admission: RE | Admit: 2012-03-02 | Discharge: 2012-03-02 | Disposition: A | Discharge status: Home / Self Care | Payer: BC Managed Care – PPO | Source: Ambulatory Visit | Attending: Orthopedic Surgery | Admitting: Orthopedic Surgery

## 2012-03-02 ENCOUNTER — Encounter (HOSPITAL_COMMUNITY): Payer: Self-pay | Admitting: *Deleted

## 2012-03-02 ENCOUNTER — Ambulatory Visit (HOSPITAL_COMMUNITY): Payer: BC Managed Care – PPO | Admitting: Anesthesiology

## 2012-03-02 DIAGNOSIS — Z01812 Encounter for preprocedural laboratory examination: Secondary | ICD-10-CM | POA: Insufficient documentation

## 2012-03-02 DIAGNOSIS — S43439A Superior glenoid labrum lesion of unspecified shoulder, initial encounter: Secondary | ICD-10-CM | POA: Insufficient documentation

## 2012-03-02 DIAGNOSIS — M7500 Adhesive capsulitis of unspecified shoulder: Secondary | ICD-10-CM | POA: Insufficient documentation

## 2012-03-02 DIAGNOSIS — Z91040 Latex allergy status: Secondary | ICD-10-CM | POA: Insufficient documentation

## 2012-03-02 DIAGNOSIS — Z880 Allergy status to penicillin: Secondary | ICD-10-CM | POA: Insufficient documentation

## 2012-03-02 HISTORY — PX: SHOULDER ARTHROSCOPY: SHX128

## 2012-03-02 SURGERY — ARTHROSCOPY, SHOULDER
Anesthesia: General | Site: Shoulder | Laterality: Right | Wound class: Clean

## 2012-03-02 MED ORDER — LIDOCAINE HCL (CARDIAC) 20 MG/ML IV SOLN
INTRAVENOUS | Status: DC | PRN
  Administered 2012-03-02: 50 mg via INTRAVENOUS

## 2012-03-02 MED ORDER — ACETAMINOPHEN 10 MG/ML IV SOLN
INTRAVENOUS | Status: AC
  Filled 2012-03-02: qty 100

## 2012-03-02 MED ORDER — SCOPOLAMINE 1 MG/3DAYS TD PT72: 1.0000 | MEDICATED_PATCH | TRANSDERMAL | Status: DC

## 2012-03-02 MED ORDER — SODIUM CHLORIDE 0.9 % IV SOLN
10.0000 mg | INTRAVENOUS | Status: DC | PRN
  Administered 2012-03-02: 15 ug/min via INTRAVENOUS

## 2012-03-02 MED ORDER — METHOCARBAMOL 500 MG PO TABS: 500.0000 mg | ORAL_TABLET | Freq: Four times a day (QID) | ORAL | Status: DC

## 2012-03-02 MED ORDER — BUPIVACAINE-EPINEPHRINE PF 0.5-1:200000 % IJ SOLN
INTRAMUSCULAR | Status: DC | PRN
  Administered 2012-03-02: 30 mL

## 2012-03-02 MED ORDER — ONDANSETRON HCL 4 MG/2ML IJ SOLN
INTRAMUSCULAR | Status: DC | PRN
  Administered 2012-03-02: 4 mg via INTRAVENOUS

## 2012-03-02 MED ORDER — SCOPOLAMINE 1 MG/3DAYS TD PT72
1.0000 | MEDICATED_PATCH | TRANSDERMAL | Status: DC
Start: 2012-03-02 — End: 2012-03-02
  Filled 2012-03-02: qty 1

## 2012-03-02 MED ORDER — OXYCODONE HCL 5 MG/5ML PO SOLN: 5.0000 mg | Freq: Once | ORAL | Status: DC | PRN

## 2012-03-02 MED ORDER — SODIUM CHLORIDE 0.9 % IR SOLN
Status: DC | PRN
  Administered 2012-03-02: 6000 mL

## 2012-03-02 MED ORDER — HYDROMORPHONE HCL PF 1 MG/ML IJ SOLN: 0.2500 mg | INTRAMUSCULAR | Status: DC | PRN

## 2012-03-02 MED ORDER — EPHEDRINE SULFATE 50 MG/ML IJ SOLN
INTRAMUSCULAR | Status: DC | PRN
  Administered 2012-03-02: 10 mg via INTRAVENOUS

## 2012-03-02 MED ORDER — GLYCOPYRROLATE 0.2 MG/ML IJ SOLN
INTRAMUSCULAR | Status: DC | PRN
  Administered 2012-03-02: 0.6 mg via INTRAVENOUS

## 2012-03-02 MED ORDER — NEOSTIGMINE METHYLSULFATE 1 MG/ML IJ SOLN
INTRAMUSCULAR | Status: DC | PRN
  Administered 2012-03-02: 4 mg via INTRAVENOUS

## 2012-03-02 MED ORDER — MIDAZOLAM HCL 5 MG/5ML IJ SOLN
INTRAMUSCULAR | Status: DC | PRN
  Administered 2012-03-02: 2 mg via INTRAVENOUS

## 2012-03-02 MED ORDER — OXYCODONE-ACETAMINOPHEN 10-325 MG PO TABS: 1.0000 | ORAL_TABLET | ORAL | Status: DC | PRN

## 2012-03-02 MED ORDER — OXYCODONE HCL 5 MG PO TABS: 5.0000 mg | ORAL_TABLET | Freq: Once | ORAL | Status: DC | PRN

## 2012-03-02 MED ORDER — VECURONIUM BROMIDE 10 MG IV SOLR
INTRAVENOUS | Status: DC | PRN
  Administered 2012-03-02: 5 mg via INTRAVENOUS

## 2012-03-02 MED ORDER — LACTATED RINGERS IV SOLN
INTRAVENOUS | Status: DC | PRN
  Administered 2012-03-02 (×2): via INTRAVENOUS

## 2012-03-02 MED ORDER — PROPOFOL 10 MG/ML IV BOLUS
INTRAVENOUS | Status: DC | PRN
  Administered 2012-03-02: 200 mg via INTRAVENOUS

## 2012-03-02 MED ORDER — DEXAMETHASONE SODIUM PHOSPHATE 4 MG/ML IJ SOLN
INTRAMUSCULAR | Status: DC | PRN
  Administered 2012-03-02: 8 mg via INTRAVENOUS

## 2012-03-02 MED ORDER — SCOPOLAMINE 1 MG/3DAYS TD PT72
MEDICATED_PATCH | TRANSDERMAL | Status: DC | PRN
  Administered 2012-03-02: 1 via TRANSDERMAL

## 2012-03-02 MED ORDER — FENTANYL CITRATE 0.05 MG/ML IJ SOLN
INTRAMUSCULAR | Status: DC | PRN
  Administered 2012-03-02 (×2): 50 ug via INTRAVENOUS

## 2012-03-02 MED ORDER — ACETAMINOPHEN 10 MG/ML IV SOLN
1000.0000 mg | Freq: Once | INTRAVENOUS | Status: AC
  Administered 2012-03-02: 1000 mg via INTRAVENOUS

## 2012-03-02 MED ORDER — ARTIFICIAL TEARS OP OINT
TOPICAL_OINTMENT | OPHTHALMIC | Status: DC | PRN
  Administered 2012-03-02: 1 via OPHTHALMIC

## 2012-03-02 SURGICAL SUPPLY — 71 items
APL SKNCLS STERI-STRIP NONHPOA (GAUZE/BANDAGES/DRESSINGS) ×1
BENZOIN TINCTURE PRP APPL 2/3 (GAUZE/BANDAGES/DRESSINGS) ×2 IMPLANT
BIT DRILL TAK (DRILL) IMPLANT
BLADE CUDA 5.5 (BLADE) IMPLANT
BLADE CUTTER GATOR 3.5 (BLADE) ×1 IMPLANT
BLADE GREAT WHITE 4.2 (BLADE) ×2 IMPLANT
BLADE SURG 11 STRL SS (BLADE) ×2 IMPLANT
BUR GATOR 2.9 (BURR) IMPLANT
BUR OVAL 6.0 (BURR) ×1 IMPLANT
CANNULA SHOULDER 7CM (CANNULA) IMPLANT
CARTRIDGE CURVETEK MED (MISCELLANEOUS) IMPLANT
CARTRIDGE CURVETEK XLRG (MISCELLANEOUS) IMPLANT
CLOTH BEACON ORANGE TIMEOUT ST (SAFETY) ×2 IMPLANT
COVER SURGICAL LIGHT HANDLE (MISCELLANEOUS) ×2 IMPLANT
DRAPE INCISE IOBAN 66X45 STRL (DRAPES) ×4 IMPLANT
DRAPE STERI 35X30 U-POUCH (DRAPES) ×2 IMPLANT
DRAPE U-SHAPE 47X51 STRL (DRAPES) ×6 IMPLANT
DRILL TAK (DRILL)
DRSG PAD ABDOMINAL 8X10 ST (GAUZE/BANDAGES/DRESSINGS) ×4 IMPLANT
DURAPREP 26ML APPLICATOR (WOUND CARE) ×2 IMPLANT
ELECT MENISCUS 165MM 90D (ELECTRODE) IMPLANT
ELECT REM PT RETURN 9FT ADLT (ELECTROSURGICAL) ×2
ELECTRODE REM PT RTRN 9FT ADLT (ELECTROSURGICAL) ×1 IMPLANT
FILTER STRAW FLUID ASPIR (MISCELLANEOUS) ×1 IMPLANT
GAUZE XEROFORM 1X8 LF (GAUZE/BANDAGES/DRESSINGS) ×2 IMPLANT
GLOVE BIO SURGEON ST LM GN SZ9 (GLOVE) ×1 IMPLANT
GLOVE BIOGEL PI IND STRL 8 (GLOVE) ×1 IMPLANT
GLOVE BIOGEL PI INDICATOR 8 (GLOVE) ×1
GLOVE SURG ORTHO 8.0 STRL STRW (GLOVE) ×1 IMPLANT
GLOVE SURG SS PI 8.0 STRL IVOR (GLOVE) ×1 IMPLANT
GOWN PREVENTION PLUS LG XLONG (DISPOSABLE) ×1 IMPLANT
GOWN STRL NON-REIN LRG LVL3 (GOWN DISPOSABLE) ×5 IMPLANT
KIT BASIN OR (CUSTOM PROCEDURE TRAY) ×2 IMPLANT
KIT ROOM TURNOVER OR (KITS) ×2 IMPLANT
MANIFOLD NEPTUNE II (INSTRUMENTS) ×2 IMPLANT
NDL HYPO 25X1 1.5 SAFETY (NEEDLE) ×1 IMPLANT
NDL SPNL 18GX3.5 QUINCKE PK (NEEDLE) ×1 IMPLANT
NDL SUT 6 .5 CRC .975X.05 MAYO (NEEDLE) ×1 IMPLANT
NEEDLE HYPO 25X1 1.5 SAFETY (NEEDLE) IMPLANT
NEEDLE MAYO TAPER (NEEDLE)
NEEDLE SPNL 18GX3.5 QUINCKE PK (NEEDLE) ×2 IMPLANT
NS IRRIG 1000ML POUR BTL (IV SOLUTION) ×2 IMPLANT
PACK SHOULDER (CUSTOM PROCEDURE TRAY) ×2 IMPLANT
PAD ARMBOARD 7.5X6 YLW CONV (MISCELLANEOUS) ×4 IMPLANT
SET ARTHROSCOPY TUBING (MISCELLANEOUS) ×2
SET ARTHROSCOPY TUBING LN (MISCELLANEOUS) ×1 IMPLANT
SLING ARM IMMOBILIZER MED (SOFTGOODS) ×1 IMPLANT
SPEAR FASTAKII (SLEEVE) IMPLANT
SPONGE GAUZE 4X4 12PLY (GAUZE/BANDAGES/DRESSINGS) ×2 IMPLANT
SPONGE LAP 4X18 X RAY DECT (DISPOSABLE) ×4 IMPLANT
STRIP CLOSURE SKIN 1/2X4 (GAUZE/BANDAGES/DRESSINGS) ×2 IMPLANT
SUCTION FRAZIER TIP 10 FR DISP (SUCTIONS) ×1 IMPLANT
SUT ETHILON 3 0 PS 1 (SUTURE) ×2 IMPLANT
SUT FIBERWIRE 2-0 18 17.9 3/8 (SUTURE)
SUT PROLENE 3 0 PS 2 (SUTURE) ×1 IMPLANT
SUT VIC AB 0 CT1 27 (SUTURE)
SUT VIC AB 0 CT1 27XBRD ANBCTR (SUTURE) ×2 IMPLANT
SUT VIC AB 1 CT1 27 (SUTURE)
SUT VIC AB 1 CT1 27XBRD ANBCTR (SUTURE) ×1 IMPLANT
SUT VIC AB 2-0 CT1 27 (SUTURE)
SUT VIC AB 2-0 CT1 TAPERPNT 27 (SUTURE) ×1 IMPLANT
SUT VICRYL 0 UR6 27IN ABS (SUTURE) IMPLANT
SUTURE FIBERWR 2-0 18 17.9 3/8 (SUTURE) IMPLANT
SYR 20CC LL (SYRINGE) ×4 IMPLANT
SYR 3ML LL SCALE MARK (SYRINGE) ×1 IMPLANT
SYR TB 1ML LUER SLIP (SYRINGE) ×2 IMPLANT
TAPE CLOTH SURG 6X10 WHT LF (GAUZE/BANDAGES/DRESSINGS) ×1 IMPLANT
TOWEL OR 17X24 6PK STRL BLUE (TOWEL DISPOSABLE) ×2 IMPLANT
TOWEL OR 17X26 10 PK STRL BLUE (TOWEL DISPOSABLE) ×2 IMPLANT
WAND 90 DEG TURBOVAC W/CORD (SURGICAL WAND) ×1 IMPLANT
WATER STERILE IRR 1000ML POUR (IV SOLUTION) ×2 IMPLANT

## 2012-03-02 NOTE — Anesthesia Preprocedure Evaluation (Addendum)
Anesthesia Evaluation  Patient identified by MRN, date of birth, ID band Patient awake    Reviewed: Allergy & Precautions, H&P , NPO status , Patient's Chart, lab work & pertinent test results  History of Anesthesia Complications (+) PONV  Airway Mallampati: II TM Distance: >3 FB Neck ROM: full    Dental No notable dental hx. (+) Teeth Intact and Dental Advidsory Given   Pulmonary sleep apnea and Continuous Positive Airway Pressure Ventilation ,  breath sounds clear to auscultation  Pulmonary exam normal       Cardiovascular negative cardio ROS  Rhythm:Regular Rate:Normal     Neuro/Psych negative neurological ROS  negative psych ROS   GI/Hepatic Neg liver ROS, GERD-  Medicated and Controlled,  Endo/Other  negative endocrine ROS  Renal/GU negative Renal ROS  negative genitourinary   Musculoskeletal   Abdominal   Peds  Hematology negative hematology ROS (+)   Anesthesia Other Findings   Reproductive/Obstetrics negative OB ROS                          Anesthesia Physical Anesthesia Plan  ASA: III  Anesthesia Plan: General and Regional   Post-op Pain Management:    Induction: Intravenous  Airway Management Planned: Oral ETT  Additional Equipment:   Intra-op Plan:   Post-operative Plan: Extubation in OR  Informed Consent: I have reviewed the patients History and Physical, chart, labs and discussed the procedure including the risks, benefits and alternatives for the proposed anesthesia with the patient or authorized representative who has indicated his/her understanding and acceptance.   Dental Advisory Given  Plan Discussed with: Anesthesiologist, CRNA and Surgeon  Anesthesia Plan Comments:        Anesthesia Quick Evaluation
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
## 2012-03-02 NOTE — Op Note (Signed)
NAME:  Anna Francis, Anna Francis           ACCOUNT NO.:  [id number]  MEDICAL RECORD NO.:  [id number]  LOCATION:  [facility]                         FACILITY:  [facility]  PHYSICIAN:  Burnard Bunting, M.D.    DATE OF BIRTH:  April 29, 1950  DATE OF PROCEDURE: DATE OF DISCHARGE:  03/02/2012                              OPERATIVE REPORT   PREOPERATIVE DIAGNOSIS:  Right frozen shoulder and superior labrum anterior and posterior tear.  POSTOPERATIVE DIAGNOSIS:  Right frozen shoulder and superior labrum anterior and posterior tear.  PROCEDURE:  Right shoulder manipulation under anesthesia, arthroscopy with extensive debridement of the labrum, biceps tenotomy and debridement of synovitis.  SURGEON:  Burnard Bunting, M.D.  ASSISTANT:  None.  ANESTHESIA:  General endotracheal.  ESTIMATED BLOOD LOSS:  10 mL.  DRAINS:  None.  INDICATIONS:  Anna Francis is a patient with right frozen shoulder, presents for operative management after explanation of risks and benefits, MRI scan consistent with nondisplaced SLAP tear.  OPERATIVE FINDINGS: 1. Examination under anesthesia, range of motion, pre-manipulation,     forward flexion 85, glenohumeral abduction 60, external rotation at     50 degrees of abduction 20, post-manipulation range of motion,     forward flexion 170, glenohumeral abduction 95, external rotation     at 15 of degrees abduction 75. 2. Intra-articular findings: 3. Type 2 SLAP tear, unstable subluxating into the joint. 4. Significant erythema and synovitis within the rotator interval and     capsule itself.  This is a very significant around the bicipital     anchor as well. 5. Intact rotator cuff. 6. Mild degenerate changes on the glenohumeral surfaces.  PROCEDURE IN DETAIL:  The patient was brought to the operating room where general endotracheal anesthesia was induced.  Preoperative antibiotics were administered.  Right shoulder was prescrubbed with alcohol and Betadine, which was  allowed to air dry, prepped with DuraPrep solution, draped in a sterile manner.  The patient was placed in a beach-chair position with the head in neutral position.  At this time prior to placing her in the upright in the beach-chair position, the arm was then manipulated into full forward flexion, abduction past 90 degrees and external rotation.  Tearing of scar tissue was present, was palpable.  At this time following positioning and prepping, the posterior portal was created 2 cm medial and anterior inferior to posterolateral margin of the acromion.  Diagnostic arthroscopy was performed.  Rotator cuff was intact.  Significant erythema and inflammation was present within the rotator interval, and there was significant synovitis throughout the joint.  This was cauterized and debrided using an ArthroCare wand and the shaver.  Unstable SLAP tear was present.  This was released in such manner that the large footprint of the labral tissue was with the biceps tendon.  In this way, it enhanced the chances for biceps to heel within the bicipital groove. Extensive debridement of synovitis and the remaining labrum was performed.  Mild posterior changes were present on the glenohumeral head and articular surface of the glenoid.  It was not elected to go into the subacromial space because the patient had no spurring and minimal bursitis on MRI scanning.  Following release and manipulation and  arthroscopic debridement, the shoulder joint was thoroughly irrigated. Instruments were removed.  The portal incisions were closed using 3-0 nylon.  The patient did have a preop block, placed in a shoulder sling. Plan is for CPM motion today.Burnard Bunting, M.D.     GSD/MEDQ  D:  03/02/2012  T:  03/02/2012  Job:  [id number]

## 2012-03-02 NOTE — H&P (Signed)
  Pt stable For r shoulder mua debridement biceos tenodesis All ? Answered Dc today to home with cpm machine

## 2012-03-02 NOTE — Anesthesia Procedure Notes (Addendum)
Anesthesia Regional Block:  Interscalene brachial plexus block  Pre-Anesthetic Checklist: ,, timeout performed, Correct Patient, Correct Site, Correct Laterality, Correct Procedure, Correct Position, site marked, Risks and benefits discussed, pre-op evaluation,  At surgeon's request and post-op pain management  Laterality: Right  Prep: Maximum Sterile Barrier Precautions used and chloraprep       Needles:  Injection technique: Single-shot  Needle Type: Echogenic Stimulator Needle      Needle Gauge: 22 and 22 G    Additional Needles:  Procedures: ultrasound guided (picture in chart) and nerve stimulator Interscalene brachial plexus block  Nerve Stimulator or Paresthesia:  Response: Biceps response, 0.4 mA,   Additional Responses:   Narrative:  Start time: 03/02/2012 7:00 AM End time: 03/02/2012 7:10 AM Injection made incrementally with aspirations every 5 mL. Anesthesiologist: Sampson Goon, MD  Additional Notes: 2% Lidocaine skin wheel.   Interscalene brachial plexus block Procedure Name: Intubation Date/Time: 03/02/2012 8:15 AM Performed by: Carmela Rima Pre-anesthesia Checklist: Patient identified, Timeout performed, Emergency Drugs available, Suction available and Patient being monitored Patient Re-evaluated:Patient Re-evaluated prior to inductionOxygen Delivery Method: Circle system utilized Preoxygenation: Pre-oxygenation with 100% oxygen Intubation Type: IV induction and Cricoid Pressure applied Ventilation: Mask ventilation without difficulty Laryngoscope Size: Mac and 3 Grade View: Grade III Tube type: Oral Tube size: 7.5 mm Number of attempts: 2 Placement Confirmation: breath sounds checked- equal and bilateral and positive ETCO2 Secured at: 22 cm Tube secured with: Tape Dental Injury: Teeth and Oropharynx as per pre-operative assessment

## 2012-03-02 NOTE — Transfer of Care (Signed)
Immediate Anesthesia Transfer of Care Note  Patient: Anna Francis  Procedure(s) Performed: Procedure(s) (LRB) with comments: ARTHROSCOPY SHOULDER (Right) - Right shoulder diagnostic operative arthroscopy, rotator interval release manipulation  Patient Location: PACU  Anesthesia Type:General  Level of Consciousness: awake, alert  and oriented  Airway & Oxygen Therapy: Patient Spontanous Breathing and Patient connected to nasal cannula oxygen  Post-op Assessment: Report given to PACU RN, Post -op Vital signs reviewed and stable and Patient moving all extremities X 4  Post vital signs: Reviewed and stable  Complications: No apparent anesthesia complications

## 2012-03-02 NOTE — Brief Op Note (Signed)
03/02/2012  9:33 AM  PATIENT:  Anna Francis  61 y.o. female  PRE-OPERATIVE DIAGNOSIS:  Right shoulder SLAP tear and frozen shoulder  POST-OPERATIVE DIAGNOSIS:  Right shoulder SLAP tear and frozen shoulder  PROCEDURE:  Procedure(s): ARTHROSCOPY SHOULDER, debridement biceps tenotomy, manipulation  SURGEON:  Surgeon(s): Cammy Copa, MD  ASSISTANT: none  ANESTHESIA:   general  EBL: 10 ml    Total I/O In: 1000 [I.V.:1000] Out: -   BLOOD ADMINISTERED: none  DRAINS: none   LOCAL MEDICATIONS USED:  none  SPECIMEN:  No Specimen  COUNTS:  YES  TOURNIQUET:  * No tourniquets in log *  DICTATION: .Other Dictation: Dictation Number [id number]  PLAN OF CARE: Discharge to home after PACU  PATIENT DISPOSITION:  PACU - hemodynamically stable

## 2012-03-02 NOTE — Preoperative (Signed)
Beta Blockers   Reason not to administer Beta Blockers:Not Applicable 

## 2012-03-02 NOTE — Anesthesia Postprocedure Evaluation (Signed)
  Anesthesia Post-op Note  Patient: Anna Francis  Procedure(s) Performed: Procedure(s) (LRB) with comments: ARTHROSCOPY SHOULDER (Right) - Right shoulder diagnostic operative arthroscopy, rotator interval release manipulation  Patient Location: PACU  Anesthesia Type:GA combined with regional for post-op pain  Level of Consciousness: awake, alert  and oriented  Airway and Oxygen Therapy: Patient Spontanous Breathing  Post-op Pain: none  Post-op Assessment: Post-op Vital signs reviewed, Patient's Cardiovascular Status Stable, Respiratory Function Stable, Patent Airway and No signs of Nausea or vomiting  Post-op Vital Signs: Reviewed and stable  Complications: No apparent anesthesia complications

## 2012-03-04 ENCOUNTER — Encounter (HOSPITAL_COMMUNITY): Payer: Self-pay | Admitting: Orthopedic Surgery

## 2012-06-07 ENCOUNTER — Other Ambulatory Visit (HOSPITAL_COMMUNITY): Payer: Self-pay | Admitting: Internal Medicine

## 2012-06-07 DIAGNOSIS — Z1231 Encounter for screening mammogram for malignant neoplasm of breast: Secondary | ICD-10-CM

## 2012-06-10 ENCOUNTER — Ambulatory Visit (HOSPITAL_COMMUNITY)
Admission: RE | Admit: 2012-06-10 | Discharge: 2012-06-10 | Disposition: A | Discharge status: Home / Self Care | Payer: BC Managed Care – PPO | Source: Ambulatory Visit | Attending: Internal Medicine | Admitting: Internal Medicine

## 2012-06-10 DIAGNOSIS — Z1231 Encounter for screening mammogram for malignant neoplasm of breast: Secondary | ICD-10-CM | POA: Insufficient documentation

## 2012-06-17 ENCOUNTER — Encounter: Payer: Self-pay | Admitting: Gastroenterology

## 2013-03-26 IMAGING — CR DG ABDOMEN 1V
2 series · 2 of 2 positions shown · non-contrast
Comparison: none

CLINICAL DATA: Left flank pain.

ABDOMEN - 1 VIEW
and pelvis exam from 09/02/2006.

[view not recorded (1 of 2)]
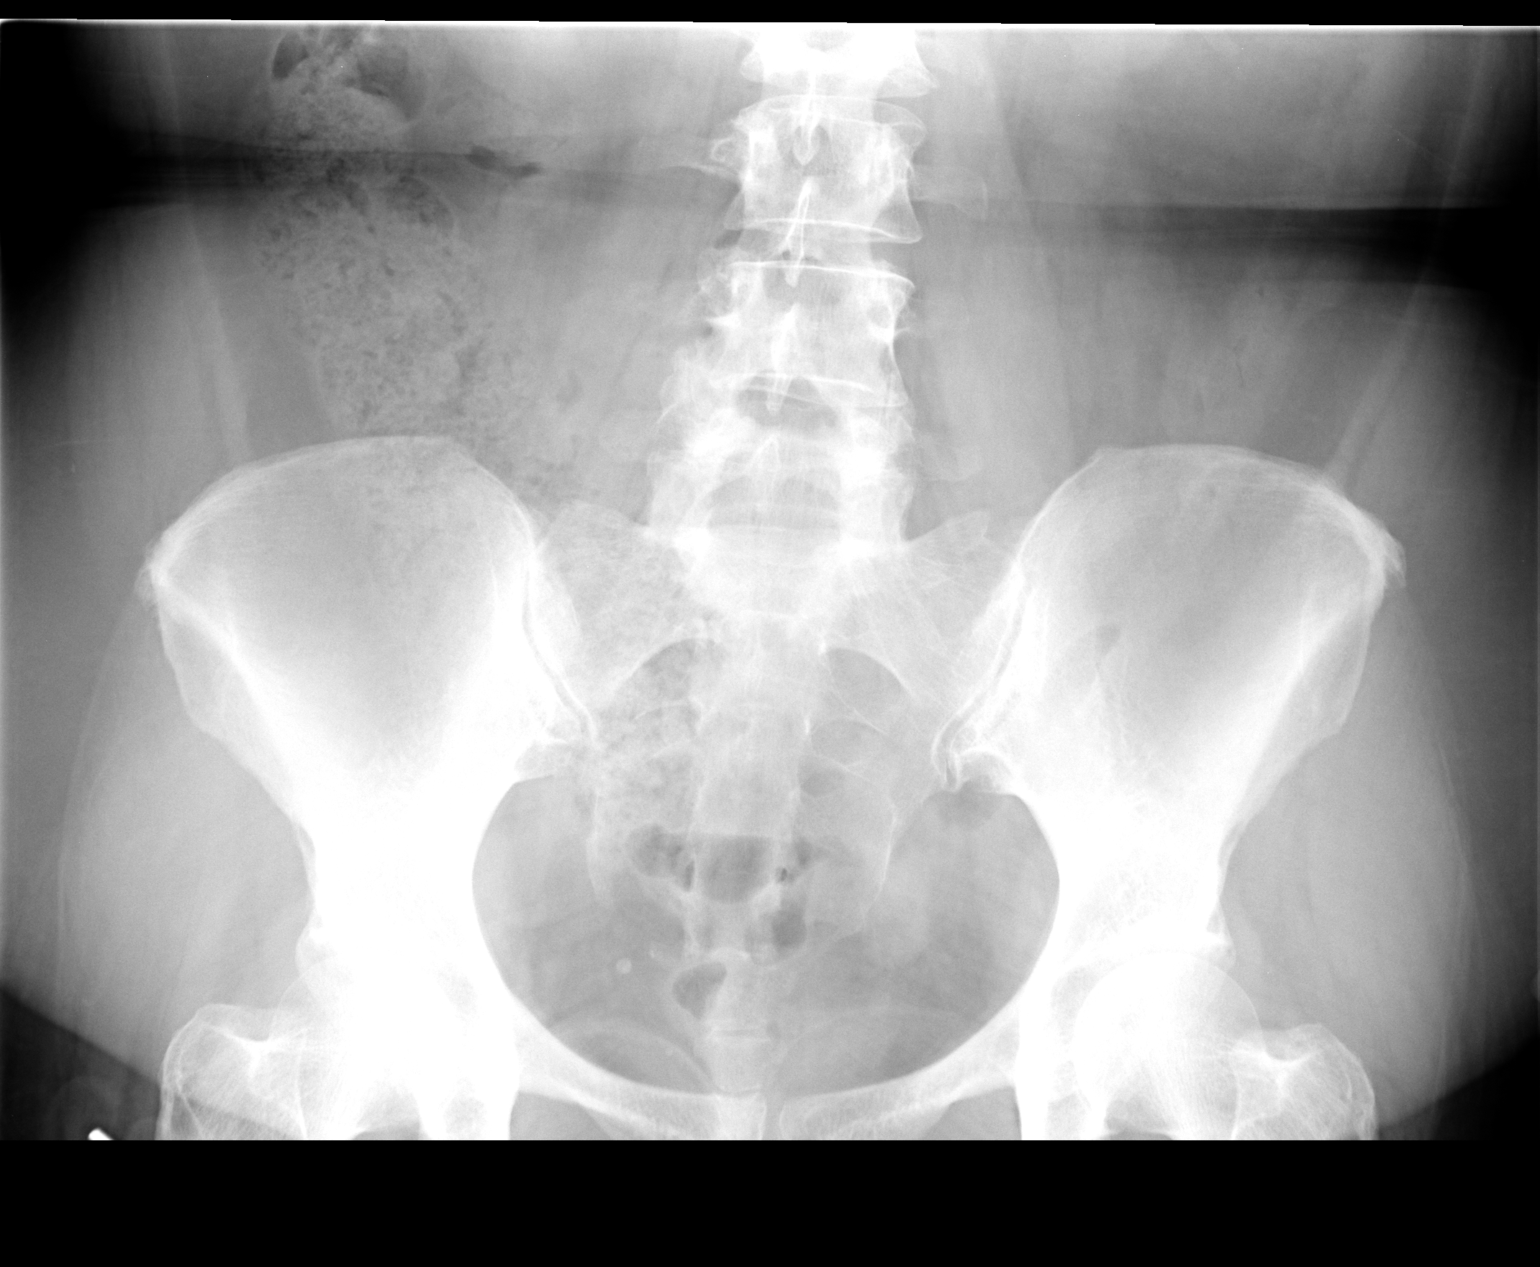

[view not recorded (2 of 2)]
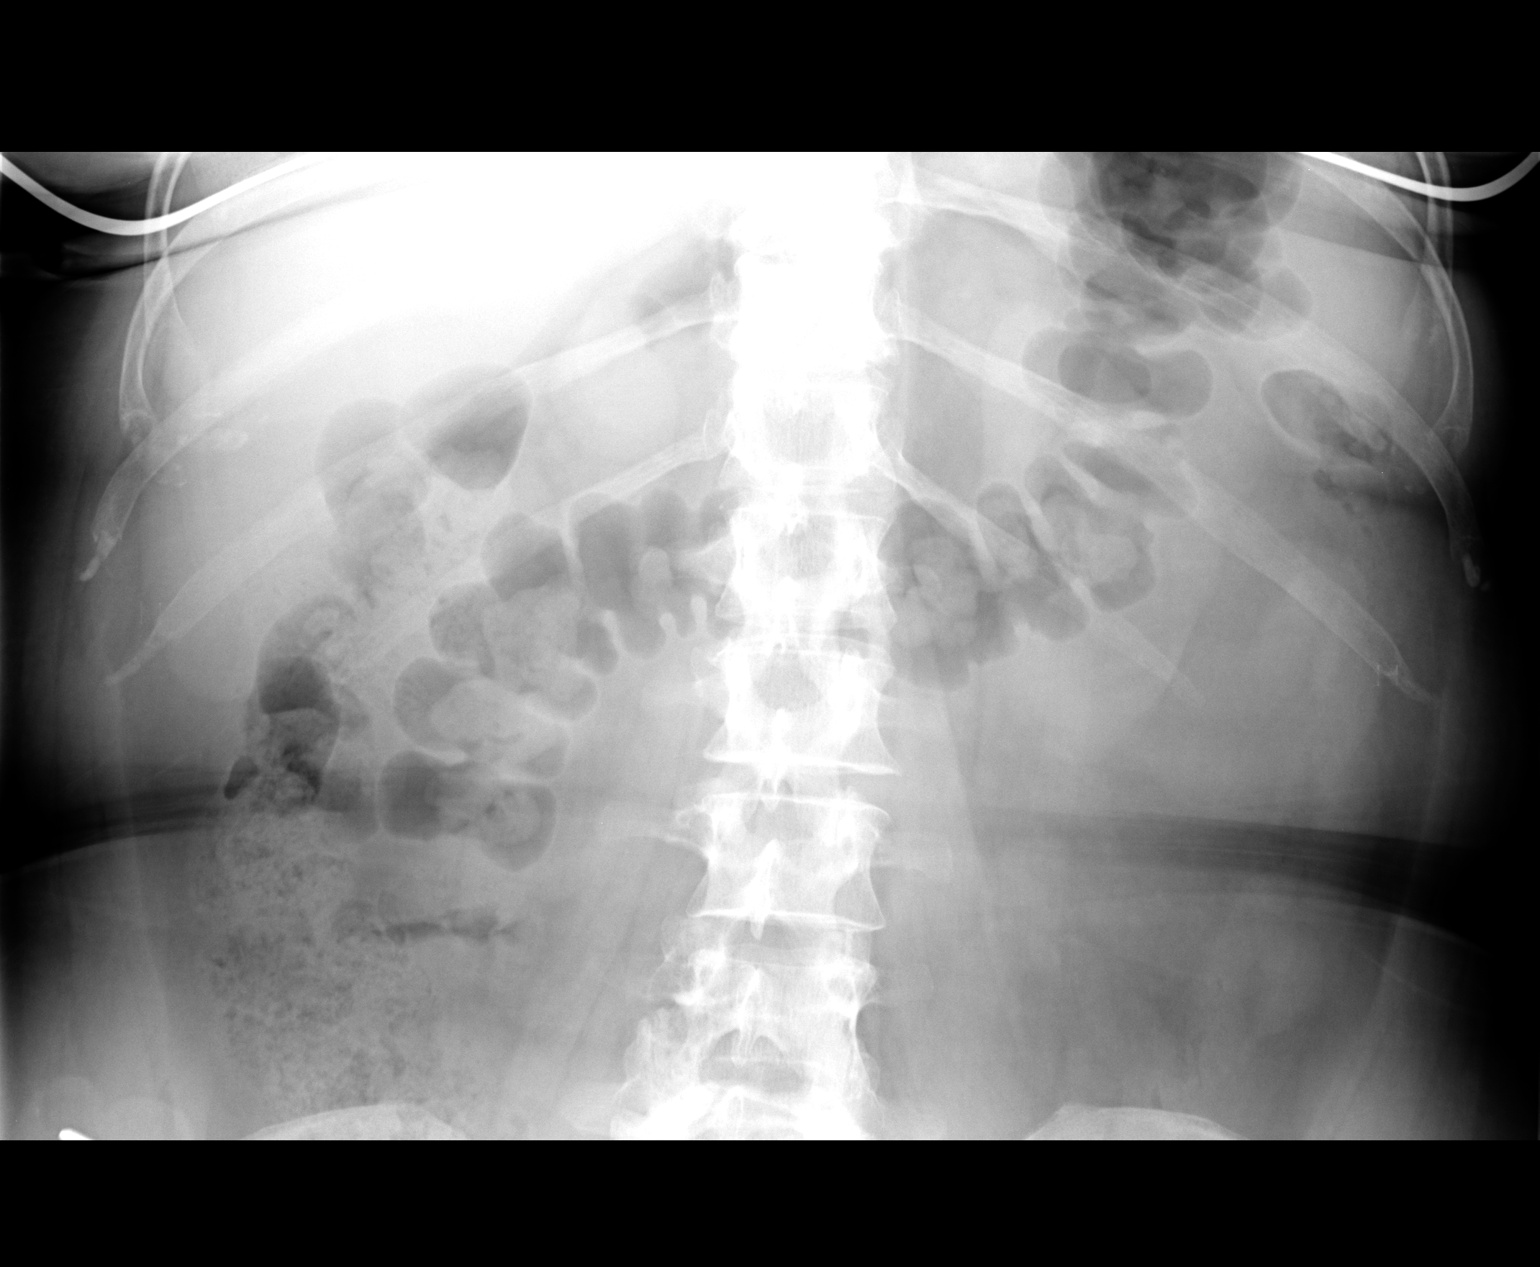

[2 of 2 positions shown; findings below may reference images not displayed]

FINDINGS: There is no evidence for gaseous bowel dilation to
suggest obstruction.  A small phlebolith in the right hemi pelvis
was present on the previous CT scan.  Visualized bony structures
are unremarkable.
IMPRESSION: Normal bowel gas pattern.

## 2013-05-18 ENCOUNTER — Other Ambulatory Visit (HOSPITAL_COMMUNITY): Payer: Self-pay | Admitting: Internal Medicine

## 2013-05-18 DIAGNOSIS — Z1231 Encounter for screening mammogram for malignant neoplasm of breast: Secondary | ICD-10-CM

## 2013-06-01 ENCOUNTER — Encounter: Payer: Self-pay | Admitting: Podiatrist

## 2013-06-01 ENCOUNTER — Ambulatory Visit (INDEPENDENT_AMBULATORY_CARE_PROVIDER_SITE_OTHER): Payer: BC Managed Care – PPO | Admitting: Podiatrist

## 2013-06-01 ENCOUNTER — Ambulatory Visit (INDEPENDENT_AMBULATORY_CARE_PROVIDER_SITE_OTHER): Payer: BC Managed Care – PPO

## 2013-06-01 VITALS — BP 124/81 | HR 69 | Resp 16 | Ht 62.0 in | Wt 205.0 lb

## 2013-06-01 DIAGNOSIS — M79609 Pain in unspecified limb: Secondary | ICD-10-CM

## 2013-06-01 DIAGNOSIS — M79673 Pain in unspecified foot: Secondary | ICD-10-CM

## 2013-06-01 DIAGNOSIS — M7750 Other enthesopathy of unspecified foot: Secondary | ICD-10-CM

## 2013-06-01 DIAGNOSIS — M778 Other enthesopathies, not elsewhere classified: Secondary | ICD-10-CM

## 2013-06-01 DIAGNOSIS — M779 Enthesopathy, unspecified: Secondary | ICD-10-CM

## 2013-06-01 MED ORDER — DICLOFENAC SODIUM 1 % TD GEL: 2.0000 g | Freq: Four times a day (QID) | TRANSDERMAL | Status: DC

## 2013-06-01 NOTE — Progress Notes (Signed)
   Subjective:    Patient ID: Anna Francis, female    DOB: 28-Feb-1951, 63 y.o.   MRN: [id number]  Subjective: Patient presents today concerned of pain sub-second digit and sub-third digit of the left foot. She states that the pain came on suddenly in December and he continues to be uncomfortable when she walks. She's tried shoe gear changes and anti-inflammatories with minimal relief in symptoms.   HPI Comments: N pain L left foot around 2nd and 3rd digits D December 2014 O sudden C ? A 0 T 0   Foot Pain      Review of Systems  All other systems reviewed and are negative.       Objective:   Physical Exam GENERAL APPEARANCE: Alert, conversant. Appropriately groomed. No acute distress.  VASCULAR: Pedal pulses palpable at 2/4 DP and PT bilateral.  Capillary refill time is immediate to all digits,  Proximal to distal cooling it warm to warm.  Digital hair growth is present bilateral  NEUROLOGIC: sensation is intact epicritically and protectively to 5.07 monofilament at 5/5 sites bilateral.  Light touch is intact bilateral, vibratory sensation intact bilateral, achilles tendon reflex is intact bilateral.  MUSCULOSKELETAL: Pain on palpation at the second metatarsal head and second metatarsophalangeal joint is noted clinically. No pain at the second or third interspace is elicited or palpated.  DERMATOLOGIC: skin color, texture, and turger are within normal limits.  No preulcerative lesions are seen, no interdigital maceration noted.  No open lesions present.  Digital nails are asymptomatic.     Assessment & Plan:  Capsulitis left second metatarsophalangeal joint versus stress fracture second metatarsal head  Plan: Discussed a steroid injection however the patient would like to hold off if she is able. She's had injections and multiple sites in her body and they've never been very beneficial for her. I recommended a air fracture walker and she will wear this consistently for 2-3  weeks. I will see her back in 3 weeks and we will reevaluate. I did not see an obvious stress fracture on x-ray however because of her symptomatology this could be a differential diagnosis. Again we'll reevaluate in 3 weeks

## 2013-06-13 ENCOUNTER — Ambulatory Visit (HOSPITAL_COMMUNITY)
Admission: RE | Admit: 2013-06-13 | Discharge: 2013-06-13 | Disposition: A | Discharge status: Home / Self Care | Payer: BC Managed Care – PPO | Source: Ambulatory Visit | Attending: Internal Medicine | Admitting: Internal Medicine

## 2013-06-13 DIAGNOSIS — Z1231 Encounter for screening mammogram for malignant neoplasm of breast: Secondary | ICD-10-CM | POA: Insufficient documentation

## 2013-06-22 ENCOUNTER — Encounter: Payer: Self-pay | Admitting: Podiatrist

## 2013-06-22 ENCOUNTER — Ambulatory Visit (INDEPENDENT_AMBULATORY_CARE_PROVIDER_SITE_OTHER): Payer: BC Managed Care – PPO | Admitting: Podiatrist

## 2013-06-22 ENCOUNTER — Ambulatory Visit (INDEPENDENT_AMBULATORY_CARE_PROVIDER_SITE_OTHER): Payer: BC Managed Care – PPO

## 2013-06-22 VITALS — BP 144/86 | HR 64 | Resp 12

## 2013-06-22 DIAGNOSIS — R52 Pain, unspecified: Secondary | ICD-10-CM

## 2013-06-22 DIAGNOSIS — D492 Neoplasm of unspecified behavior of bone, soft tissue, and skin: Secondary | ICD-10-CM

## 2013-06-22 DIAGNOSIS — M8430XA Stress fracture, unspecified site, initial encounter for fracture: Secondary | ICD-10-CM

## 2013-06-22 DIAGNOSIS — M779 Enthesopathy, unspecified: Secondary | ICD-10-CM

## 2013-06-22 NOTE — Progress Notes (Signed)
  Subjective:  Patient presents for follow up of 2nd metatarsal and 2nd toe pain of the left foot.  She has been wearing a short air fracture walker for the last 3 weeks and has noticed minimal improvement.  She states the foot hurts when she turns it a certain way.  She also relates no trauma or injury to the foot that she is aware of.  Objective findings are unchanged:  VASCULAR: Pedal pulses palpable at 2/4 DP and PT bilateral. Capillary refill time is immediate to all digits, Proximal to distal cooling it warm to warm. Digital hair growth is present bilateral  NEUROLOGIC: sensation is intact epicritically and protectively to 5.07 monofilament at 5/5 sites bilateral. Light touch is intact bilateral, vibratory sensation intact bilateral, achilles tendon reflex is intact bilateral.  MUSCULOSKELETAL: Pain on palpation at the second metatarsal head and second metatarsophalangeal joint is noted clinically. Discomfort with dorsal pressure on the 2nd toe is also present.  No pain at the second or third interspace is elicited or palpated. No dorsal translation of the digit is seen clinically.  Rectus alignment of the digit is present.  DERMATOLOGIC: skin color, texture, and turger are within normal limits. No preulcerative lesions are seen, no interdigital maceration noted. No open lesions present. Digital nails are asymptomatic.   Xrays-- 3 additional xray views were taken today-- obliques and a sesamoid axial.  On all 3 films there is an area of increased density at the neck of the 3rd metatarsal.  Difficult to assess if it is a healing stress fracture or a possible bone tumor or cyst.  Assessment:  Bone tumor vs. Stress fracture vs. Capsulitis  Plan:  Again discussed the xray finding and the shadowing seen on the films an all view.  Recommended an MRI vs. CT scan for this area.  Will discuss with radiologist and decide on most appropriate test.  Will also have them check her insurance coverage as well.   Until the mri is performed, she will stay in her boot.  I will see her when mri is complete. A supportive strapping was applied today as well.

## 2013-06-24 ENCOUNTER — Telehealth: Payer: Self-pay | Admitting: *Deleted

## 2013-06-24 DIAGNOSIS — M8430XA Stress fracture, unspecified site, initial encounter for fracture: Secondary | ICD-10-CM

## 2013-06-24 DIAGNOSIS — D492 Neoplasm of unspecified behavior of bone, soft tissue, and skin: Secondary | ICD-10-CM

## 2013-06-24 NOTE — Telephone Encounter (Signed)
I called and informed the patient that Dr. Valentina Lucks wants her to have a CT Scan and the estimate is $659.  She said to go ahead and proceed.  I told her someone from [facility] should give her a call to schedule the appointment.

## 2013-06-24 NOTE — Telephone Encounter (Signed)
Message copied by Lolita Rieger on Fri Jun 24, 2013 12:53 PM ------      Message from: Bronson Ing      Created: Thu Jun 23, 2013 12:07 PM      Regarding: RE: MRI       OK,      CT with Contrast it is-- see if she is interested with the cost and book it for her-- thanks for checking for me!                  ----- Message -----         From: Mammie Russian         Sent: 06/23/2013   8:56 AM           To: Trudie Buckler, DPM      Subject: RE: MRI                                                  Radiologist said if you are looking at bone, CT Scan is better with contrast.  If soft tissue do MRI with and without contrast.  Patient's estimate for [employer] is $659.73 for CT Scan.      ----- Message -----         From: Trudie Buckler, DPM         Sent: 06/22/2013   5:41 PM           To: Mammie Russian      Subject: MRI                                                      Michal Callicott,            Can we ask the MRI people to do an estimate for the patient --            Can you also ask if for bone tumors which one is better-- MRI  (forefoot) versus a CT scan?            Diagnosis is Bone Tumor-- (vs. stress fracture of the head of the 2nd metatarsal)            Thanks!                         ------

## 2013-06-24 NOTE — Telephone Encounter (Signed)
I spoke with [facility] in regards to CT Scan Lt foot with contrast.  Wrong order was put in for 3D scan.  Correction was made

## 2013-06-24 NOTE — Telephone Encounter (Signed)
Message copied by Lolita Rieger on Fri Jun 24, 2013 11:37 AM ------      Message from: Bronson Ing      Created: Thu Jun 23, 2013 12:07 PM      Regarding: RE: MRI       OK,      CT with Contrast it is-- see if she is interested with the cost and book it for her-- thanks for checking for me!                  ----- Message -----         From: Mammie Russian         Sent: 06/23/2013   8:56 AM           To: Trudie Buckler, DPM      Subject: RE: MRI                                                  Radiologist said if you are looking at bone, CT Scan is better with contrast.  If soft tissue do MRI with and without contrast.  Patient's estimate for [employer] is $659.73 for CT Scan.      ----- Message -----         From: Trudie Buckler, DPM         Sent: 06/22/2013   5:41 PM           To: Mammie Russian      Subject: MRI                                                      Anna Francis,            Can we ask the MRI people to do an estimate for the patient --            Can you also ask if for bone tumors which one is better-- MRI  (forefoot) versus a CT scan?            Diagnosis is Bone Tumor-- (vs. stress fracture of the head of the 2nd metatarsal)            Thanks!                         ------

## 2013-06-27 ENCOUNTER — Telehealth: Payer: Self-pay | Admitting: *Deleted

## 2013-06-27 NOTE — Telephone Encounter (Signed)
Please give me a call.  It's regarding a CT Scan of her left foot with contrast.  We only need to do it without contrast.  Give me a call.  I returned her call and informed her that a Radiologist suggested with and without contrast but when I called to schedule I was told can't do both.  She said she would leave it as it is then.  She asked the name of the Radiologist but I could not recall.

## 2013-06-28 ENCOUNTER — Inpatient Hospital Stay: Admission: RE | Admit: 2013-06-28 | Payer: BC Managed Care – PPO | Source: Ambulatory Visit

## 2013-06-29 ENCOUNTER — Telehealth: Payer: Self-pay | Admitting: *Deleted

## 2013-06-29 NOTE — Telephone Encounter (Signed)
Patient canceled her CT Scan due to insurance problems.

## 2013-06-29 NOTE — Telephone Encounter (Signed)
OK, recommend she stay in the boot== we can see how it does after some more time in the boot.

## 2013-06-30 ENCOUNTER — Other Ambulatory Visit: Payer: BC Managed Care – PPO

## 2013-06-30 ENCOUNTER — Telehealth: Payer: Self-pay | Admitting: *Deleted

## 2013-06-30 NOTE — Telephone Encounter (Signed)
I called and informed the patient to continue wearing the boot per Dr. Valentina Lucks.  Patient stated that she had to reinstate her insurance.  She will call to reschedule her CT Scan once it is reinstated.

## 2013-07-04 ENCOUNTER — Telehealth: Payer: Self-pay | Admitting: *Deleted

## 2013-07-04 NOTE — Telephone Encounter (Signed)
[facility] sent over a request for prior authorization for Voltaren Gel.  Authorization was denied.  I sent the response to [facility].

## 2013-07-27 ENCOUNTER — Telehealth: Payer: Self-pay | Admitting: *Deleted

## 2013-07-27 NOTE — Telephone Encounter (Signed)
I need to know where to call to reschedule my CT-Scan.  I called and informed her [facility] and their phone number is [phone].  She said thanks for calling.

## 2013-07-29 ENCOUNTER — Telehealth: Payer: Self-pay | Admitting: *Deleted

## 2013-07-29 NOTE — Telephone Encounter (Signed)
No info found showing Josem Kaufmann was being obtained called [employer] spoke with auth services spoke with [facility] case nurse gave history and neg xray finding auth approved 07/29/13 auth # [id number] good for 30 days. Leslie Dales at [facility] imaging and left auth number on her voicemail   MP

## 2013-08-01 ENCOUNTER — Ambulatory Visit
Admission: RE | Admit: 2013-08-01 | Discharge: 2013-08-01 | Disposition: A | Discharge status: Home / Self Care | Payer: BC Managed Care – PPO | Source: Ambulatory Visit | Attending: Podiatrist | Admitting: Podiatrist

## 2013-08-01 DIAGNOSIS — D492 Neoplasm of unspecified behavior of bone, soft tissue, and skin: Secondary | ICD-10-CM

## 2013-08-01 DIAGNOSIS — M8430XA Stress fracture, unspecified site, initial encounter for fracture: Secondary | ICD-10-CM

## 2013-08-01 MED ORDER — IOHEXOL 300 MG/ML  SOLN
100.0000 mL | Freq: Once | INTRAMUSCULAR | Status: AC | PRN
  Administered 2013-08-01: 100 mL via INTRAVENOUS

## 2013-08-03 ENCOUNTER — Telehealth: Payer: Self-pay | Admitting: *Deleted

## 2013-08-03 NOTE — Telephone Encounter (Signed)
Message copied by Lolita Rieger on Wed Aug 03, 2013  3:23 PM ------      Message from: Bronson Ing      Created: Wed Aug 03, 2013  2:52 PM      Regarding: ct scan report       Ct scan read as normal-- no sign of fracture and the spot on xray is a benign bone island.  Ok to make a follow up appt to discuss options.            Thanks.      ----- Message -----         From: Rad Results In Interface         Sent: 08/01/2013   3:16 PM           To: Trudie Buckler, DPM                   ------

## 2013-08-03 NOTE — Telephone Encounter (Signed)
I called and informed the patient that we got her results back and Dr. Valentina Lucks wants her to schedule an appointment to go over the results.  She said she would call back in the morning to make an appointment because she was out and about and didn't know her schedule.

## 2013-08-05 ENCOUNTER — Ambulatory Visit (INDEPENDENT_AMBULATORY_CARE_PROVIDER_SITE_OTHER): Payer: BC Managed Care – PPO | Admitting: Podiatrist

## 2013-08-05 ENCOUNTER — Encounter: Payer: Self-pay | Admitting: Podiatrist

## 2013-08-05 VITALS — BP 134/66 | HR 67 | Resp 12

## 2013-08-05 DIAGNOSIS — D492 Neoplasm of unspecified behavior of bone, soft tissue, and skin: Secondary | ICD-10-CM

## 2013-08-05 MED ORDER — MELOXICAM 15 MG PO TABS: 15.0000 mg | ORAL_TABLET | Freq: Every day | ORAL | Status: DC

## 2013-08-06 NOTE — Progress Notes (Signed)
Subjective: Anna Francis presents today for CT scan results.  She relates she has been wearing her boot and the foot has seemed to improve.    Objective:  Neurovascular status unchanged.  Generalized discomfort at the 2nd metatarsal region continues to be present.    CT scan results were discussed and reported to the patient which showed a bone island within the 2nd metatarsal with no other osseous abnormalities present.  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  We can try a steriod injection if the pain does not subside.. At this time improvement is noted.

## 2014-03-23 ENCOUNTER — Encounter: Payer: Self-pay | Admitting: Gastroenterology

## 2014-08-07 ENCOUNTER — Other Ambulatory Visit (HOSPITAL_COMMUNITY): Payer: Self-pay | Admitting: Internal Medicine

## 2014-08-07 DIAGNOSIS — Z1231 Encounter for screening mammogram for malignant neoplasm of breast: Secondary | ICD-10-CM

## 2014-08-16 ENCOUNTER — Ambulatory Visit (HOSPITAL_COMMUNITY)
Admission: RE | Admit: 2014-08-16 | Discharge: 2014-08-16 | Disposition: A | Discharge status: Home / Self Care | Payer: BLUE CROSS/BLUE SHIELD | Source: Ambulatory Visit | Attending: Internal Medicine | Admitting: Internal Medicine

## 2014-08-16 DIAGNOSIS — Z1231 Encounter for screening mammogram for malignant neoplasm of breast: Secondary | ICD-10-CM | POA: Insufficient documentation

## 2017-07-15 DIAGNOSIS — M9902 Segmental and somatic dysfunction of thoracic region: Secondary | ICD-10-CM | POA: Diagnosis not present

## 2017-07-15 DIAGNOSIS — M5135 Other intervertebral disc degeneration, thoracolumbar region: Secondary | ICD-10-CM | POA: Diagnosis not present

## 2017-07-15 DIAGNOSIS — M5031 Other cervical disc degeneration,  high cervical region: Secondary | ICD-10-CM | POA: Diagnosis not present

## 2017-07-15 DIAGNOSIS — M9901 Segmental and somatic dysfunction of cervical region: Secondary | ICD-10-CM | POA: Diagnosis not present

## 2017-07-23 DIAGNOSIS — M9902 Segmental and somatic dysfunction of thoracic region: Secondary | ICD-10-CM | POA: Diagnosis not present

## 2017-07-23 DIAGNOSIS — M531 Cervicobrachial syndrome: Secondary | ICD-10-CM | POA: Diagnosis not present

## 2017-07-23 DIAGNOSIS — M9901 Segmental and somatic dysfunction of cervical region: Secondary | ICD-10-CM | POA: Diagnosis not present

## 2017-07-23 DIAGNOSIS — M9903 Segmental and somatic dysfunction of lumbar region: Secondary | ICD-10-CM | POA: Diagnosis not present

## 2017-07-23 DIAGNOSIS — S29012A Strain of muscle and tendon of back wall of thorax, initial encounter: Secondary | ICD-10-CM | POA: Diagnosis not present

## 2017-07-23 DIAGNOSIS — M5137 Other intervertebral disc degeneration, lumbosacral region: Secondary | ICD-10-CM | POA: Diagnosis not present

## 2017-07-27 DIAGNOSIS — M9903 Segmental and somatic dysfunction of lumbar region: Secondary | ICD-10-CM | POA: Diagnosis not present

## 2017-07-27 DIAGNOSIS — M5137 Other intervertebral disc degeneration, lumbosacral region: Secondary | ICD-10-CM | POA: Diagnosis not present

## 2017-07-27 DIAGNOSIS — M531 Cervicobrachial syndrome: Secondary | ICD-10-CM | POA: Diagnosis not present

## 2017-07-27 DIAGNOSIS — S29012A Strain of muscle and tendon of back wall of thorax, initial encounter: Secondary | ICD-10-CM | POA: Diagnosis not present

## 2017-07-27 DIAGNOSIS — M9902 Segmental and somatic dysfunction of thoracic region: Secondary | ICD-10-CM | POA: Diagnosis not present

## 2017-07-27 DIAGNOSIS — M9901 Segmental and somatic dysfunction of cervical region: Secondary | ICD-10-CM | POA: Diagnosis not present

## 2017-07-31 DIAGNOSIS — Z0000 Encounter for general adult medical examination without abnormal findings: Secondary | ICD-10-CM | POA: Diagnosis not present

## 2017-07-31 DIAGNOSIS — R300 Dysuria: Secondary | ICD-10-CM | POA: Diagnosis not present

## 2017-07-31 DIAGNOSIS — Z131 Encounter for screening for diabetes mellitus: Secondary | ICD-10-CM | POA: Diagnosis not present

## 2017-07-31 DIAGNOSIS — E559 Vitamin D deficiency, unspecified: Secondary | ICD-10-CM | POA: Diagnosis not present

## 2017-07-31 DIAGNOSIS — Z13220 Encounter for screening for lipoid disorders: Secondary | ICD-10-CM | POA: Diagnosis not present

## 2017-07-31 DIAGNOSIS — R5383 Other fatigue: Secondary | ICD-10-CM | POA: Diagnosis not present

## 2017-08-14 DIAGNOSIS — E559 Vitamin D deficiency, unspecified: Secondary | ICD-10-CM | POA: Diagnosis not present

## 2017-08-14 DIAGNOSIS — E7800 Pure hypercholesterolemia, unspecified: Secondary | ICD-10-CM | POA: Diagnosis not present

## 2017-09-01 DIAGNOSIS — Z1231 Encounter for screening mammogram for malignant neoplasm of breast: Secondary | ICD-10-CM | POA: Diagnosis not present

## 2017-09-22 DIAGNOSIS — H524 Presbyopia: Secondary | ICD-10-CM | POA: Diagnosis not present

## 2017-09-22 DIAGNOSIS — H25093 Other age-related incipient cataract, bilateral: Secondary | ICD-10-CM | POA: Diagnosis not present

## 2017-09-22 DIAGNOSIS — H52223 Regular astigmatism, bilateral: Secondary | ICD-10-CM | POA: Diagnosis not present

## 2017-11-03 DIAGNOSIS — Z23 Encounter for immunization: Secondary | ICD-10-CM | POA: Diagnosis not present

## 2017-11-03 DIAGNOSIS — E7849 Other hyperlipidemia: Secondary | ICD-10-CM | POA: Diagnosis not present

## 2017-11-03 DIAGNOSIS — Z1389 Encounter for screening for other disorder: Secondary | ICD-10-CM | POA: Diagnosis not present

## 2017-11-03 DIAGNOSIS — R413 Other amnesia: Secondary | ICD-10-CM | POA: Diagnosis not present

## 2017-11-03 DIAGNOSIS — Z6837 Body mass index (BMI) 37.0-37.9, adult: Secondary | ICD-10-CM | POA: Diagnosis not present

## 2017-11-03 DIAGNOSIS — J3089 Other allergic rhinitis: Secondary | ICD-10-CM | POA: Diagnosis not present

## 2017-11-03 DIAGNOSIS — E559 Vitamin D deficiency, unspecified: Secondary | ICD-10-CM | POA: Diagnosis not present

## 2017-11-03 DIAGNOSIS — E785 Hyperlipidemia, unspecified: Secondary | ICD-10-CM | POA: Diagnosis not present

## 2017-11-03 DIAGNOSIS — K219 Gastro-esophageal reflux disease without esophagitis: Secondary | ICD-10-CM | POA: Diagnosis not present

## 2017-11-11 DIAGNOSIS — R030 Elevated blood-pressure reading, without diagnosis of hypertension: Secondary | ICD-10-CM | POA: Diagnosis not present

## 2017-11-13 DIAGNOSIS — R030 Elevated blood-pressure reading, without diagnosis of hypertension: Secondary | ICD-10-CM | POA: Diagnosis not present

## 2017-11-17 DIAGNOSIS — R413 Other amnesia: Secondary | ICD-10-CM | POA: Diagnosis not present

## 2017-11-17 DIAGNOSIS — E7849 Other hyperlipidemia: Secondary | ICD-10-CM | POA: Diagnosis not present

## 2017-11-17 DIAGNOSIS — Z6838 Body mass index (BMI) 38.0-38.9, adult: Secondary | ICD-10-CM | POA: Diagnosis not present

## 2017-11-17 DIAGNOSIS — R030 Elevated blood-pressure reading, without diagnosis of hypertension: Secondary | ICD-10-CM | POA: Diagnosis not present

## 2017-11-17 DIAGNOSIS — E559 Vitamin D deficiency, unspecified: Secondary | ICD-10-CM | POA: Diagnosis not present

## 2018-03-24 DIAGNOSIS — K5730 Diverticulosis of large intestine without perforation or abscess without bleeding: Secondary | ICD-10-CM | POA: Diagnosis not present

## 2018-03-24 DIAGNOSIS — Z86010 Personal history of colonic polyps: Secondary | ICD-10-CM | POA: Diagnosis not present

## 2018-03-24 DIAGNOSIS — K635 Polyp of colon: Secondary | ICD-10-CM | POA: Diagnosis not present

## 2018-03-30 DIAGNOSIS — K635 Polyp of colon: Secondary | ICD-10-CM | POA: Diagnosis not present

## 2018-04-14 DIAGNOSIS — I10 Essential (primary) hypertension: Secondary | ICD-10-CM | POA: Diagnosis not present

## 2018-04-14 DIAGNOSIS — R319 Hematuria, unspecified: Secondary | ICD-10-CM | POA: Diagnosis not present

## 2018-04-14 DIAGNOSIS — N3090 Cystitis, unspecified without hematuria: Secondary | ICD-10-CM | POA: Diagnosis not present

## 2018-04-14 DIAGNOSIS — K582 Mixed irritable bowel syndrome: Secondary | ICD-10-CM | POA: Diagnosis not present

## 2018-04-28 DIAGNOSIS — I10 Essential (primary) hypertension: Secondary | ICD-10-CM | POA: Diagnosis not present

## 2018-04-28 DIAGNOSIS — J019 Acute sinusitis, unspecified: Secondary | ICD-10-CM | POA: Diagnosis not present

## 2018-04-28 DIAGNOSIS — J060 Acute laryngopharyngitis: Secondary | ICD-10-CM | POA: Diagnosis not present

## 2018-05-05 DIAGNOSIS — K581 Irritable bowel syndrome with constipation: Secondary | ICD-10-CM | POA: Diagnosis not present

## 2018-05-05 DIAGNOSIS — I10 Essential (primary) hypertension: Secondary | ICD-10-CM | POA: Diagnosis not present

## 2018-05-05 DIAGNOSIS — R319 Hematuria, unspecified: Secondary | ICD-10-CM | POA: Diagnosis not present

## 2018-05-05 DIAGNOSIS — K580 Irritable bowel syndrome with diarrhea: Secondary | ICD-10-CM | POA: Diagnosis not present

## 2018-05-05 DIAGNOSIS — E782 Mixed hyperlipidemia: Secondary | ICD-10-CM | POA: Diagnosis not present

## 2018-05-05 DIAGNOSIS — Z0000 Encounter for general adult medical examination without abnormal findings: Secondary | ICD-10-CM | POA: Diagnosis not present

## 2018-05-17 DIAGNOSIS — N764 Abscess of vulva: Secondary | ICD-10-CM | POA: Diagnosis not present

## 2018-09-07 DIAGNOSIS — Z1231 Encounter for screening mammogram for malignant neoplasm of breast: Secondary | ICD-10-CM | POA: Diagnosis not present

## 2018-11-03 DIAGNOSIS — M25551 Pain in right hip: Secondary | ICD-10-CM | POA: Diagnosis not present

## 2018-11-03 DIAGNOSIS — G4730 Sleep apnea, unspecified: Secondary | ICD-10-CM | POA: Diagnosis not present

## 2018-11-03 DIAGNOSIS — I10 Essential (primary) hypertension: Secondary | ICD-10-CM | POA: Diagnosis not present

## 2018-11-24 DIAGNOSIS — I10 Essential (primary) hypertension: Secondary | ICD-10-CM | POA: Diagnosis not present

## 2018-11-24 DIAGNOSIS — G4730 Sleep apnea, unspecified: Secondary | ICD-10-CM | POA: Diagnosis not present

## 2018-11-24 DIAGNOSIS — M25551 Pain in right hip: Secondary | ICD-10-CM | POA: Diagnosis not present

## 2018-11-24 DIAGNOSIS — N3001 Acute cystitis with hematuria: Secondary | ICD-10-CM | POA: Diagnosis not present

## 2018-12-14 DIAGNOSIS — D1801 Hemangioma of skin and subcutaneous tissue: Secondary | ICD-10-CM | POA: Diagnosis not present

## 2018-12-14 DIAGNOSIS — L821 Other seborrheic keratosis: Secondary | ICD-10-CM | POA: Diagnosis not present

## 2018-12-14 DIAGNOSIS — D225 Melanocytic nevi of trunk: Secondary | ICD-10-CM | POA: Diagnosis not present

## 2018-12-14 DIAGNOSIS — L814 Other melanin hyperpigmentation: Secondary | ICD-10-CM | POA: Diagnosis not present

## 2018-12-14 DIAGNOSIS — L720 Epidermal cyst: Secondary | ICD-10-CM | POA: Diagnosis not present

## 2018-12-14 DIAGNOSIS — L820 Inflamed seborrheic keratosis: Secondary | ICD-10-CM | POA: Diagnosis not present

## 2018-12-16 ENCOUNTER — Other Ambulatory Visit (HOSPITAL_BASED_OUTPATIENT_CLINIC_OR_DEPARTMENT_OTHER): Payer: Self-pay

## 2018-12-16 DIAGNOSIS — G478 Other sleep disorders: Secondary | ICD-10-CM

## 2019-01-05 ENCOUNTER — Other Ambulatory Visit: Payer: Self-pay

## 2019-01-05 ENCOUNTER — Ambulatory Visit: Payer: PPO | Attending: Internal Medicine | Admitting: Neurology

## 2019-01-05 DIAGNOSIS — G478 Other sleep disorders: Secondary | ICD-10-CM

## 2019-01-05 DIAGNOSIS — Z791 Long term (current) use of non-steroidal anti-inflammatories (NSAID): Secondary | ICD-10-CM | POA: Insufficient documentation

## 2019-01-05 DIAGNOSIS — Z79899 Other long term (current) drug therapy: Secondary | ICD-10-CM | POA: Insufficient documentation

## 2019-01-05 DIAGNOSIS — G4733 Obstructive sleep apnea (adult) (pediatric): Secondary | ICD-10-CM | POA: Insufficient documentation

## 2019-01-18 NOTE — Procedures (Signed)
   Hayden A. Merlene Laughter, MD     [email or web address]              HOME SLEEP TEST   LOCATION: [facility]  Patient Name: Anna Francis, Anna Francis Date: 01/05/2019 Gender: Female D.O.B: [birth year] Age (years): 68 Referring Provider: Delphina Cahill Height (inches): 60 Interpreting Physician: Phillips Odor MD, ABSM Weight (lbs): 205 RPSGT: Peak, Robert BMI: 37 MRN: [id number] Neck Size: CLINICAL INFORMATION Sleep Study Type: HST     Indication for sleep study: N/A     Epworth Sleepiness Score: NA  SLEEP STUDY TECHNIQUE A multi-channel overnight portable sleep study was performed. The channels recorded were: nasal airflow, thoracic respiratory movement, and oxygen saturation with a pulse oximetry. Snoring was also monitored.  MEDICATIONS Patient self administered medications include: N/A.  Current Outpatient Medications:  .  Acetaminophen (TYLENOL PO), Take by mouth as needed., Disp: , Rfl:  .  Cholecalciferol (VITAMIN D PO), Take 1 capsule by mouth daily., Disp: , Rfl:  .  diclofenac sodium (VOLTAREN) 1 % GEL, Apply 2 g topically 4 (four) times daily. Rub into affected area of foot 2 to 4 times daily, Disp: 100 g, Rfl: 2 .  Lansoprazole (PREVACID PO), Take by mouth as needed., Disp: , Rfl:  .  meloxicam (MOBIC) 15 MG tablet, Take 1 tablet (15 mg total) by mouth daily., Disp: 30 tablet, Rfl: 2   SLEEP ARCHITECTURE Patient was studied for 445.4 minutes. The sleep efficiency was 87.4 % and the patient was supine for 16.9%. The arousal index was 0.0 per hour.  RESPIRATORY PARAMETERS The overall AHI was 42.3 per hour, with a central apnea index of 3.4 per hour.  The oxygen nadir was 76% during sleep.     CARDIAC DATA Mean heart rate during sleep was 63.1 bpm.  IMPRESSIONS Severe obstructive sleep apnea occurred during this study (AHI = 42.3/h).  Auto PAP 8-14 is recommended.   Delano Metz, MD Diplomate, American Board of Sleep Medicine.   ELECTRONICALLY SIGNED ON:  01/18/2019, 2:06 PM [place] PH: [phone]   FX: [phone] [place]

## 2019-02-15 DIAGNOSIS — G4733 Obstructive sleep apnea (adult) (pediatric): Secondary | ICD-10-CM | POA: Diagnosis not present

## 2019-03-18 DIAGNOSIS — G4733 Obstructive sleep apnea (adult) (pediatric): Secondary | ICD-10-CM | POA: Diagnosis not present

## 2019-04-19 DIAGNOSIS — D225 Melanocytic nevi of trunk: Secondary | ICD-10-CM | POA: Diagnosis not present

## 2019-04-19 DIAGNOSIS — D485 Neoplasm of uncertain behavior of skin: Secondary | ICD-10-CM | POA: Diagnosis not present

## 2019-04-19 DIAGNOSIS — L720 Epidermal cyst: Secondary | ICD-10-CM | POA: Diagnosis not present

## 2019-04-19 DIAGNOSIS — C44319 Basal cell carcinoma of skin of other parts of face: Secondary | ICD-10-CM | POA: Diagnosis not present

## 2019-05-09 DIAGNOSIS — C44319 Basal cell carcinoma of skin of other parts of face: Secondary | ICD-10-CM | POA: Diagnosis not present

## 2019-05-09 DIAGNOSIS — Z85828 Personal history of other malignant neoplasm of skin: Secondary | ICD-10-CM | POA: Diagnosis not present

## 2019-05-16 DIAGNOSIS — Z4802 Encounter for removal of sutures: Secondary | ICD-10-CM | POA: Diagnosis not present

## 2019-09-21 DIAGNOSIS — G4730 Sleep apnea, unspecified: Secondary | ICD-10-CM | POA: Diagnosis not present

## 2019-09-21 DIAGNOSIS — N905 Atrophy of vulva: Secondary | ICD-10-CM | POA: Diagnosis not present

## 2019-09-21 DIAGNOSIS — N3001 Acute cystitis with hematuria: Secondary | ICD-10-CM | POA: Diagnosis not present

## 2019-09-21 DIAGNOSIS — L292 Pruritus vulvae: Secondary | ICD-10-CM | POA: Diagnosis not present

## 2019-09-21 DIAGNOSIS — R002 Palpitations: Secondary | ICD-10-CM | POA: Diagnosis not present

## 2019-09-21 DIAGNOSIS — R0600 Dyspnea, unspecified: Secondary | ICD-10-CM | POA: Diagnosis not present

## 2019-09-21 DIAGNOSIS — I10 Essential (primary) hypertension: Secondary | ICD-10-CM | POA: Diagnosis not present

## 2019-09-21 DIAGNOSIS — M25551 Pain in right hip: Secondary | ICD-10-CM | POA: Diagnosis not present

## 2019-10-06 ENCOUNTER — Other Ambulatory Visit: Payer: Self-pay

## 2019-10-06 ENCOUNTER — Encounter (INDEPENDENT_AMBULATORY_CARE_PROVIDER_SITE_OTHER): Payer: Self-pay | Admitting: Family Medicine

## 2019-10-06 ENCOUNTER — Ambulatory Visit (INDEPENDENT_AMBULATORY_CARE_PROVIDER_SITE_OTHER): Payer: PPO | Admitting: Family Medicine

## 2019-10-06 VITALS — BP 148/89 | HR 64 | Temp 97.6°F | Ht 62.0 in | Wt 209.0 lb

## 2019-10-06 DIAGNOSIS — G4733 Obstructive sleep apnea (adult) (pediatric): Secondary | ICD-10-CM

## 2019-10-06 DIAGNOSIS — E559 Vitamin D deficiency, unspecified: Secondary | ICD-10-CM

## 2019-10-06 DIAGNOSIS — F3289 Other specified depressive episodes: Secondary | ICD-10-CM

## 2019-10-06 DIAGNOSIS — R0602 Shortness of breath: Secondary | ICD-10-CM | POA: Diagnosis not present

## 2019-10-06 DIAGNOSIS — R5383 Other fatigue: Secondary | ICD-10-CM | POA: Diagnosis not present

## 2019-10-06 DIAGNOSIS — I10 Essential (primary) hypertension: Secondary | ICD-10-CM

## 2019-10-06 DIAGNOSIS — Z6838 Body mass index (BMI) 38.0-38.9, adult: Secondary | ICD-10-CM | POA: Diagnosis not present

## 2019-10-06 DIAGNOSIS — Z0289 Encounter for other administrative examinations: Secondary | ICD-10-CM

## 2019-10-06 NOTE — Progress Notes (Signed)
Chief Complaint:   OBESITY Anna Francis (MR# [id number]) is a 69 y.o. female who presents for evaluation and treatment of obesity and related comorbidities. Current BMI is Body mass index is 38.23 kg/m. Anna Francis has been struggling with her weight for many years and has been unsuccessful in either losing weight, maintaining weight loss, or reaching her healthy weight goal.  Anna Francis is currently in the action stage of change and ready to dedicate time achieving and maintaining a healthier weight. Anna Francis is interested in becoming our patient and working on intensive lifestyle modifications including (but not limited to) diet and exercise for weight loss.  Anna Francis states she gained weight after she developed "food sensitivities" after a reaction to drugs.  She has tried keto, Weight Watchers in the past and lost 50 pounds, but gained it back.  She lives with Roselyn Reef, her husband, whom I also see.  She craves sweets, breads, and fried foods.  She eats out 21 times a week.  She snacks on popcorn and donuts.  Skips breakfast often.  Anna Francis's habits were reviewed today and are as follows: Her family eats meals together, she thinks her family will eat healthier with her, her desired weight loss is 80 pounds, she started gaining weight in 2004 after a drug reaction, her heaviest weight ever is her current weight, she is somewhat of a picky eater and doesn't like to eat healthier foods, she craves sweets, breads, and fried foods, she skips breakfast frequently, she is frequently drinking liquids with calories, she frequently makes poor food choices and she struggles with emotional eating.  Depression Screen Anna Francis's Food and Mood (modified PHQ-9) score was 17.  Depression screen PHQ 2/9 10/06/2019  Decreased Interest 1  Down, Depressed, Hopeless 3  PHQ - 2 Score 4  Altered sleeping 3  Tired, decreased energy 3  Change in appetite 3  Feeling bad or failure about yourself  1  Trouble  concentrating 3  Moving slowly or fidgety/restless 0  Suicidal thoughts 0  PHQ-9 Score 17  Difficult doing work/chores Not difficult at all   Subjective:   1. Other fatigue Anna Francis denies daytime somnolence and reports waking up still tired. Patent has a history of symptoms of morning fatigue and snoring. Adabelle generally gets 4 or 5 hours of sleep per night, and states that she has poor quality sleep. Snoring is present. Apneic episodes are not present. Epworth Sleepiness Score is 4.  2. SOB (shortness of breath) on exertion Anna Francis notes increasing shortness of breath with exercising and seems to be worsening over time with weight gain. She notes getting out of breath sooner with activity than she used to. This has gotten worse recently. Anna Francis denies shortness of breath at rest or orthopnea.  3. Essential hypertension Review: taking medications as instructed, no medication side effects noted, no chest pain on exertion, no dyspnea on exertion, no swelling of ankles.  She is not on medications.  She has white coat syndrome.  Last week at the doctor, blood pressure was 130/80.  Usually well-controlled.  She is asymptomatic.  BP Readings from Last 3 Encounters:  10/06/19 (!) 148/89  08/05/13 134/66  06/22/13 (!) 144/86   4. OSA (obstructive sleep apnea) Anna Francis has a diagnosis of sleep apnea. She reports that she is not using a CPAP regularly.  She was diagnosed in October 2020.  5. Vitamin Anna Francis deficiency She is currently taking no vitamin Anna Francis supplement. She denies nausea, vomiting or muscle weakness.  6. Other  depression with emotional eating Anna Francis is struggling with emotional eating and using food for comfort to the extent that it is negatively impacting her health. She has been working on behavior modification techniques to help reduce her emotional eating and has been unsuccessful. She shows no sign of suicidal or homicidal ideations.  Assessment/Plan:   1. Other fatigue Anna Francis  does feel that her weight is causing her energy to be lower than it should be. Fatigue may be related to obesity, depression or many other causes. Labs will be ordered, and in the meanwhile, Anna Francis will focus on self care including making healthy food choices, increasing physical activity and focusing on stress reduction. - EKG 12-Lead - TSH - T4 - T3 - Folate - Vitamin B12  2. SOB (shortness of breath) on exertion Anna Francis does feel that she gets out of breath more easily that she used to when she exercises. Anna Francis's shortness of breath appears to be obesity related and exercise induced. She has agreed to work on weight loss and gradually increase exercise to treat her exercise induced shortness of breath. Will continue to monitor closely. - Folate - Vitamin B12 - Insulin, random - Hemoglobin A1c  3. Essential hypertension Anna Francis is working on healthy weight loss and exercise to improve blood pressure control. We will watch for signs of hypotension as she continues her lifestyle modifications.  Home blood pressure monitoring, follow-up with PCP if above goal, prudent nutritional plan, weight loss.  Check labs today. - CBC with Differential/Platelet - Lipid panel - Comprehensive metabolic panel  4. OSA (obstructive sleep apnea) Intensive lifestyle modifications are the first line treatment for this issue. We discussed several lifestyle modifications today and she will continue to work on diet, exercise and weight loss efforts. We will continue to monitor. Orders and follow up as documented in patient record.   Counseling  Sleep apnea is a condition in which breathing pauses or becomes shallow during sleep. This happens over and over during the night. This disrupts your sleep and keeps your body from getting the rest that it needs, which can cause tiredness and lack of energy (fatigue) during the day.  Sleep apnea treatment: If you were given a device to open your airway while you sleep,  USE IT!  Sleep hygiene:   Limit or avoid alcohol, caffeinated beverages, and cigarettes, especially close to bedtime.   Do not eat a large meal or eat spicy foods right before bedtime. This can lead to digestive discomfort that can make it hard for you to sleep.  Keep a sleep diary to help you and your health care provider figure out what could be causing your insomnia.   Make your bedroom a dark, comfortable place where it is easy to fall asleep. ? Put up shades or blackout curtains to block light from outside. ? Use a white noise machine to block noise. ? Keep the temperature cool.  Limit screen use before bedtime. This includes: ? Watching TV. ? Using your smartphone, tablet, or computer.  Stick to a routine that includes going to bed and waking up at the same times every day and night. This can help you fall asleep faster. Consider making a quiet activity, such as reading, part of your nighttime routine.  Try to avoid taking naps during the day so that you sleep better at night.  Get out of bed if you are still awake after 15 minutes of trying to sleep. Keep the lights down, but try reading or doing a  quiet activity. When you feel sleepy, go back to bed.  5. Vitamin Anna Francis deficiency Low Vitamin Anna Francis level contributes to fatigue and are associated with obesity, breast, and colon cancer.  - VITAMIN Anna Francis 25 Hydroxy (Vit-Anna Francis Deficiency, Fractures)  6. Other depression with emotional eating Patient was referred to Dr. Mallie Mussel, our Bariatric Psychologist, for evaluation due to her elevated PHQ-9 score and significant struggles with emotional eating.  7. Class 2 severe obesity with serious comorbidity and body mass index (BMI) of 38.0 to 38.9 in adult, unspecified obesity type (HCC) Anna Francis is currently in the action stage of change and her goal is to continue with weight loss efforts. I recommend Anna Francis begin the structured treatment plan as follows:  She has agreed to the Category 1  Plan.  Exercise goals: As is.   Behavioral modification strategies: increasing water intake.  She was informed of the importance of frequent follow-up visits to maximize her success with intensive lifestyle modifications for her multiple health conditions. She was informed we would discuss her lab results at her next visit unless there is a critical issue that needs to be addressed sooner. Anna Francis agreed to keep her next visit at the agreed upon time to discuss these results.  Objective:   Blood pressure (!) 148/89, pulse 64, temperature 97.6 F (36.4 C), temperature source Anna Francis, height 5\' 2"  (1.575 m), weight 209 lb (94.8 kg), SpO2 98 %. Body mass index is 38.23 kg/m.  EKG: Normal sinus rhythm, rate 64 bpm.  Indirect Calorimeter completed today shows a VO2 of 197 and a REE of 1374.  Her calculated basal metabolic rate is 1610 thus her basal metabolic rate is worse than expected.  General: Cooperative, alert, well developed, in no acute distress. HEENT: Conjunctivae and lids unremarkable. Cardiovascular: Regular rhythm.  Lungs: Normal work of breathing. Neurologic: No focal deficits.   Lab Results  Component Value Date   CREATININE 0.77 10/06/2019   BUN 20 10/06/2019   NA 139 10/06/2019   K 4.8 10/06/2019   CL 99 10/06/2019   CO2 24 10/06/2019   Lab Results  Component Value Date   ALT 20 10/06/2019   AST 16 10/06/2019   ALKPHOS 87 10/06/2019   BILITOT 0.3 10/06/2019   Lab Results  Component Value Date   WBC 9.5 10/06/2019   HGB 14.8 10/06/2019   HCT 45.1 10/06/2019   MCV 91 10/06/2019   PLT 324 10/06/2019   Obesity Behavioral Intervention Visit Documentation for Insurance:   Approximately 15 minutes were spent on the discussion below.  ASK: We discussed the diagnosis of obesity with Anna Francis today and Anna Francis agreed to give [place] permission to discuss obesity behavioral modification therapy today.  ASSESS: Dillyn has the diagnosis of obesity and her BMI today is  38.3. Annitta is in the action stage of change.   ADVISE: Sila was educated on the multiple health risks of obesity as well as the benefit of weight loss to improve her health. She was advised of the need for long term treatment and the importance of lifestyle modifications to improve her current health and to decrease her risk of future health problems.  AGREE: Multiple dietary modification options and treatment options were discussed and Anna Francis agreed to follow the recommendations documented in the above note.  ARRANGE: Anna Francis was educated on the importance of frequent visits to treat obesity as outlined per CMS and USPSTF guidelines and agreed to schedule her next follow up appointment today.  Attestation Statements:   Reviewed by clinician  on day of visit: allergies, medications, problem list, medical history, surgical history, family history, social history, and previous encounter notes.  I, Water quality scientist, CMA, am acting as Location manager for [employer], DO.  I have reviewed the above documentation for accuracy and completeness, and I agree with the above. Mellody Dance, DO

## 2019-10-07 LAB — CBC WITH DIFFERENTIAL/PLATELET
Basophils Absolute: 0.1 10*3/uL (ref 0.0–0.2)
Basos: 1 %
EOS (ABSOLUTE): 0.1 10*3/uL (ref 0.0–0.4)
Eos: 1 %
Hematocrit: 45.1 % (ref 34.0–46.6)
Hemoglobin: 14.8 g/dL (ref 11.1–15.9)
Immature Grans (Abs): 0 10*3/uL (ref 0.0–0.1)
Immature Granulocytes: 0 %
Lymphocytes Absolute: 2.9 10*3/uL (ref 0.7–3.1)
Lymphs: 30 %
MCH: 29.8 pg (ref 26.6–33.0)
MCHC: 32.8 g/dL (ref 31.5–35.7)
MCV: 91 fL (ref 79–97)
Monocytes Absolute: 0.6 10*3/uL (ref 0.1–0.9)
Monocytes: 6 %
Neutrophils Absolute: 5.9 10*3/uL (ref 1.4–7.0)
Neutrophils: 62 %
Platelets: 324 10*3/uL (ref 150–450)
RBC: 4.96 x10E6/uL (ref 3.77–5.28)
RDW: 12.9 % (ref 11.7–15.4)
WBC: 9.5 10*3/uL (ref 3.4–10.8)

## 2019-10-07 LAB — COMPREHENSIVE METABOLIC PANEL
ALT: 20 IU/L (ref 0–32)
AST: 16 IU/L (ref 0–40)
Albumin/Globulin Ratio: 1.7 (ref 1.2–2.2)
Albumin: 4.7 g/dL (ref 3.8–4.8)
Alkaline Phosphatase: 87 IU/L (ref 48–121)
BUN/Creatinine Ratio: 26 (ref 12–28)
BUN: 20 mg/dL (ref 8–27)
Bilirubin Total: 0.3 mg/dL (ref 0.0–1.2)
CO2: 24 mmol/L (ref 20–29)
Calcium: 9.6 mg/dL (ref 8.7–10.3)
Chloride: 99 mmol/L (ref 96–106)
Creatinine, Ser: 0.77 mg/dL (ref 0.57–1.00)
GFR calc Af Amer: 92 mL/min/{1.73_m2} (ref >59)
GFR calc non Af Amer: 80 mL/min/{1.73_m2} (ref >59)
Globulin, Total: 2.7 g/dL (ref 1.5–4.5)
Glucose: 85 mg/dL (ref 65–99)
Potassium: 4.8 mmol/L (ref 3.5–5.2)
Sodium: 139 mmol/L (ref 134–144)
Total Protein: 7.4 g/dL (ref 6.0–8.5)

## 2019-10-07 LAB — LIPID PANEL
Chol/HDL Ratio: 3.5 ratio (ref 0.0–4.4)
Cholesterol, Total: 221 mg/dL — ABNORMAL HIGH (ref 100–199)
HDL: 63 mg/dL (ref >39)
LDL Chol Calc (NIH): 143 mg/dL — ABNORMAL HIGH (ref 0–99)
Triglycerides: 88 mg/dL (ref 0–149)
VLDL Cholesterol Cal: 15 mg/dL (ref 5–40)

## 2019-10-07 LAB — INSULIN, RANDOM: INSULIN: 40.3 u[IU]/mL — ABNORMAL HIGH (ref 2.6–24.9)

## 2019-10-07 LAB — VITAMIN D 25 HYDROXY (VIT D DEFICIENCY, FRACTURES): Vit D, 25-Hydroxy: 20.4 ng/mL — ABNORMAL LOW (ref 30.0–100.0)

## 2019-10-07 LAB — TSH: TSH: 2.99 u[IU]/mL (ref 0.450–4.500)

## 2019-10-07 LAB — HEMOGLOBIN A1C
Est. average glucose Bld gHb Est-mCnc: 114 mg/dL
Hgb A1c MFr Bld: 5.6 % (ref 4.8–5.6)

## 2019-10-07 LAB — T4: T4, Total: 7 ug/dL (ref 4.5–12.0)

## 2019-10-07 LAB — VITAMIN B12: Vitamin B-12: 398 pg/mL (ref 232–1245)

## 2019-10-07 LAB — FOLATE: Folate: 2.8 ng/mL — ABNORMAL LOW (ref >3.0)

## 2019-10-07 LAB — T3: T3, Total: 148 ng/dL (ref 71–180)

## 2019-10-13 DIAGNOSIS — L821 Other seborrheic keratosis: Secondary | ICD-10-CM | POA: Diagnosis not present

## 2019-10-13 DIAGNOSIS — Z85828 Personal history of other malignant neoplasm of skin: Secondary | ICD-10-CM | POA: Diagnosis not present

## 2019-10-13 DIAGNOSIS — L720 Epidermal cyst: Secondary | ICD-10-CM | POA: Diagnosis not present

## 2019-10-13 DIAGNOSIS — L814 Other melanin hyperpigmentation: Secondary | ICD-10-CM | POA: Diagnosis not present

## 2019-10-13 DIAGNOSIS — D225 Melanocytic nevi of trunk: Secondary | ICD-10-CM | POA: Diagnosis not present

## 2019-10-17 DIAGNOSIS — H2513 Age-related nuclear cataract, bilateral: Secondary | ICD-10-CM | POA: Diagnosis not present

## 2019-10-17 DIAGNOSIS — H5203 Hypermetropia, bilateral: Secondary | ICD-10-CM | POA: Diagnosis not present

## 2019-10-20 ENCOUNTER — Other Ambulatory Visit: Payer: Self-pay

## 2019-10-20 ENCOUNTER — Ambulatory Visit (INDEPENDENT_AMBULATORY_CARE_PROVIDER_SITE_OTHER): Payer: PPO | Admitting: Family Medicine

## 2019-10-20 ENCOUNTER — Encounter (INDEPENDENT_AMBULATORY_CARE_PROVIDER_SITE_OTHER): Payer: Self-pay | Admitting: Family Medicine

## 2019-10-20 VITALS — BP 142/90 | HR 73 | Temp 97.8°F | Ht 62.0 in | Wt 203.0 lb

## 2019-10-20 DIAGNOSIS — E538 Deficiency of other specified B group vitamins: Secondary | ICD-10-CM | POA: Diagnosis not present

## 2019-10-20 DIAGNOSIS — Z6837 Body mass index (BMI) 37.0-37.9, adult: Secondary | ICD-10-CM

## 2019-10-20 DIAGNOSIS — E559 Vitamin D deficiency, unspecified: Secondary | ICD-10-CM

## 2019-10-20 DIAGNOSIS — R7303 Prediabetes: Secondary | ICD-10-CM | POA: Diagnosis not present

## 2019-10-20 DIAGNOSIS — E7849 Other hyperlipidemia: Secondary | ICD-10-CM | POA: Diagnosis not present

## 2019-10-20 NOTE — Patient Instructions (Signed)
The 10-year ASCVD risk score Mikey Bussing DC Brooke Bonito., et al., 2013) is: 8.4%   Values used to calculate the score:     Age: 69 years     Sex: Female     Is Non-Hispanic African American: No     Diabetic: No     Tobacco smoker: No     Systolic Blood Pressure: 161 mmHg     Is BP treated: No     HDL Cholesterol: 63 mg/dL     Total Cholesterol: 221 mg/dL

## 2019-10-21 ENCOUNTER — Encounter (INDEPENDENT_AMBULATORY_CARE_PROVIDER_SITE_OTHER): Payer: Self-pay | Admitting: Family Medicine

## 2019-10-24 MED ORDER — VITAMIN D (ERGOCALCIFEROL) 1.25 MG (50000 UNIT) PO CAPS: 50000.0000 [IU] | ORAL_CAPSULE | ORAL | 0 refills | Status: DC

## 2019-10-24 NOTE — Progress Notes (Signed)
Chief Complaint:   OBESITY Latorria is here to discuss her progress with her obesity treatment plan along with follow-up of her obesity related diagnoses. Jariah is on the Category 1 Plan and states she is following her eating plan approximately 80% of the time. Solangel states she is exercising for 0 minutes 0 times per week.  Today's visit was #: 2 Starting weight: 209 lbs Starting date: 10/06/2019 Today's weight: 203 lbs Today's date: 10/20/2019 Total lbs lost to date: 6 lbs Total lbs lost since last in-office visit: 6 lbs  Interim History: Aldonia had a tough 2 weeks with her husband's medical condition.  She is very stressed because, "I don't like eggs, yogurt, cheese".  She cannot more than 1/2 the food on the plan.  She cannot drink too much water because she has "swallowing issues".  She can eat roast beef from "[employer]" but cannot eat the roast beef from [employer].  She does not have trouble eating all 6 ounces of meat at night though.  She eats chicken and beef kabobs, but when asked, she is not weighing foods.  However, upon questioning, she has eaten out several days, eating off plan.  Subjective:   1. Other hyperlipidemia Riham has hyperlipidemia and has been trying to improve her cholesterol levels with intensive lifestyle modification including a low saturated fat diet, exercise and weight loss. She denies any chest pain, claudication or myalgias.  Elevated LDL to 143 (new for her).  She tells me her HDL is usually really high to compensate and never needed medication in the past.  ASCVD >8% now.  Lab Results  Component Value Date   ALT 20 10/06/2019   AST 16 10/06/2019   ALKPHOS 87 10/06/2019   BILITOT 0.3 10/06/2019   Lab Results  Component Value Date   CHOL 221 (H) 10/06/2019   HDL 63 10/06/2019   LDLCALC 143 (H) 10/06/2019   TRIG 88 10/06/2019   CHOLHDL 3.5 10/06/2019   2. Folate deficiency Cannot tolerate multivitamin.  She cannot take folate  due to intolerance, she says.  It makes her too constipated.  Lab Results  Component Value Date   FOLATE 2.8 (L) 10/06/2019   3. Vitamin D deficiency Derrisha's Vitamin D level was 20.4 on 10/06/2019. She is currently taking OTC vitamin D 1000 IU each day. She denies nausea, vomiting or muscle weakness.    4. Prediabetes Leocadia has a diagnosis of prediabetes based on her elevated HgA1c and was informed this puts her at greater risk of developing diabetes. She continues to work on diet and exercise to decrease her risk of diabetes. She denies nausea or hypoglycemia.  Elevated insulin level.  Lab Results  Component Value Date   HGBA1C 5.6 10/06/2019   Lab Results  Component Value Date   INSULIN 40.3 (H) 10/06/2019   Assessment/Plan:   1. Other hyperlipidemia New.  Discussed labs with patient today.  Cardiovascular risk and specific lipid/LDL goals reviewed.  We discussed several lifestyle modifications today and Twala will continue to work on diet, exercise and weight loss efforts. Orders and follow up as documented in patient record.  Follow-up with PCP regarding possible statin therapy.  She declines medication.  She wants to try diet and recheck in a "couple of weeks".  I recommend 3-4 month recheck after following prudent nutritional plan, weight loss.   Counseling Intensive lifestyle modifications are the first line treatment for this issue. . Dietary changes: Increase soluble fiber. Decrease simple carbohydrates. Marland Kitchen  Exercise changes: Moderate to vigorous-intensity aerobic activity 150 minutes per week if tolerated. . Lipid-lowering medications: see documented in medical record.  2. Folate deficiency Discussed labs with patient today.  Recommend following prudent nutritional plan, which is much healthier than a typical American diet, and her levels should improve.  3. Vitamin D deficiency Discussed labs with patient today.  Low Vitamin D level contributes to fatigue and are  associated with obesity, breast, and colon cancer. She agrees to start to take prescription Vitamin D @50 ,000 IU every week and will follow-up for routine testing of Vitamin D, at least 2-3 times per year to avoid over-replacement.  Recheck level after 2-3 months.  4. Prediabetes Discussed labs with patient today.  Dasha will continue to work on weight loss, exercise, and decreasing simple carbohydrates to help decrease the risk of diabetes.  Recheck in 3 months.  Continue prudent nutritional plan, weight loss.  Extensive counseling done.  5. Class 2 severe obesity with serious comorbidity and body mass index (BMI) of 37.0 to 37.9 in adult, unspecified obesity type (HCC) Darina is currently in the action stage of change. As such, her goal is to continue with weight loss efforts. She has agreed to the Category 1 Plan with additional lunch options.   Exercise goals: As is.  Behavioral modification strategies: increasing lean protein intake, decreasing simple carbohydrates, decreasing sodium intake, meal planning and cooking strategies, keeping healthy foods in the home and planning for success.  Kameka has agreed to follow-up with our clinic in 2 weeks. She was informed of the importance of frequent follow-up visits to maximize her success with intensive lifestyle modifications for her multiple health conditions.   Objective:   Blood pressure (!) 142/90, pulse 73, temperature 97.8 F (36.6 C), height 5\' 2"  (1.575 m), weight 203 lb (92.1 kg), SpO2 96 %. Body mass index is 37.13 kg/m.  General: Cooperative, alert, well developed, in no acute distress. HEENT: Conjunctivae and lids unremarkable. Cardiovascular: Regular rhythm.  Lungs: Normal work of breathing. Neurologic: No focal deficits.   Lab Results  Component Value Date   CREATININE 0.77 10/06/2019   BUN 20 10/06/2019   NA 139 10/06/2019   K 4.8 10/06/2019   CL 99 10/06/2019   CO2 24 10/06/2019   Lab Results  Component Value  Date   ALT 20 10/06/2019   AST 16 10/06/2019   ALKPHOS 87 10/06/2019   BILITOT 0.3 10/06/2019   Lab Results  Component Value Date   HGBA1C 5.6 10/06/2019   Lab Results  Component Value Date   INSULIN 40.3 (H) 10/06/2019   Lab Results  Component Value Date   TSH 2.990 10/06/2019   Lab Results  Component Value Date   CHOL 221 (H) 10/06/2019   HDL 63 10/06/2019   LDLCALC 143 (H) 10/06/2019   TRIG 88 10/06/2019   CHOLHDL 3.5 10/06/2019   Lab Results  Component Value Date   WBC 9.5 10/06/2019   HGB 14.8 10/06/2019   HCT 45.1 10/06/2019   MCV 91 10/06/2019   PLT 324 10/06/2019   Obesity Behavioral Intervention Documentation for Insurance:   Approximately 15 minutes were spent on the discussion below.  ASK: We discussed the diagnosis of obesity with Anushri today and Halimah agreed to give [place] permission to discuss obesity behavioral modification therapy today.  ASSESS: Lyda has the diagnosis of obesity and her BMI today is 37.2. Jasmeet is in the action stage of change.   ADVISE: Teniyah was educated on the multiple health  risks of obesity as well as the benefit of weight loss to improve her health. She was advised of the need for long term treatment and the importance of lifestyle modifications to improve her current health and to decrease her risk of future health problems.  AGREE: Multiple dietary modification options and treatment options were discussed and Arora agreed to follow the recommendations documented in the above note.  ARRANGE: Latesa was educated on the importance of frequent visits to treat obesity as outlined per CMS and USPSTF guidelines and agreed to schedule her next follow up appointment today.  Attestation Statements:   Reviewed by clinician on day of visit: allergies, medications, problem list, medical history, surgical history, family history, social history, and previous encounter notes.  I, Water quality scientist, CMA, am acting as  Location manager for [employer], DO.  I have reviewed the above documentation for accuracy and completeness, and I agree with the above. Mellody Dance, DO

## 2019-10-26 NOTE — Telephone Encounter (Signed)
Please review and address as soon as possible

## 2019-10-26 NOTE — Telephone Encounter (Signed)
Call to patient, medication was for her and not her husband.  She picked up the prescription on Monday for herself.  No other actions needed. Call ended.

## 2019-10-28 ENCOUNTER — Telehealth: Payer: Self-pay | Admitting: Radiology

## 2019-10-28 ENCOUNTER — Encounter: Payer: Self-pay | Admitting: Cardiovascular Disease

## 2019-10-28 ENCOUNTER — Ambulatory Visit (INDEPENDENT_AMBULATORY_CARE_PROVIDER_SITE_OTHER): Payer: PPO | Admitting: Cardiovascular Disease

## 2019-10-28 ENCOUNTER — Other Ambulatory Visit: Payer: Self-pay

## 2019-10-28 VITALS — BP 134/90 | HR 78 | Ht 62.0 in | Wt 202.8 lb

## 2019-10-28 DIAGNOSIS — I10 Essential (primary) hypertension: Secondary | ICD-10-CM

## 2019-10-28 DIAGNOSIS — R002 Palpitations: Secondary | ICD-10-CM

## 2019-10-28 NOTE — Progress Notes (Signed)
Cardiology Office Note:    Date:  10/28/2019   ID:  Anna Francis, DOB 1951/02/22, MRN [id number]  PCP:  Anna Squibb, MD  [place] Cardiologist:  No primary care provider on file.  [facility] Electrophysiologist:  None   Referring MD: Anna American, FNP   Chief Complaint  Patient presents with  . Palpitations    Aug. 13, 2021     Anna Francis is a 69 y.o. female with a hx of  HTN. We were asked to see her for further evaluation of her palpitations by Anna Francis , FNP   Several months ago .  She has had palpitations that wake her up at night. palps were shaking the bed.   Lasted several minutes.  Has been fatigued since April  Vit -D levels were low ,   Is with the [facility] Weight loss program - has lost 7. 4 lbs   No CP ,    palps seemed to be regular .  May be heat related.  She notes that these are more common after she has taken a hot bath or when she is out in the sun    Past Medical History:  Diagnosis Date  . Asthma   . Back pain   . Constipation   . Food allergy   . GERD (gastroesophageal reflux disease)   . Hiatal hernia   . Hypertension   . IBS (irritable bowel syndrome)   . Joint pain   . Kidney stone   . Kidney stones   . Lactose intolerance   . Pancreatitis   . Pinched nerve    right hip  . PONV (postoperative nausea and vomiting)   . Ruptured thoracic disc   . Sleep apnea    has not used CPAP in a year  . Spinal stenosis   . Swallowing difficulty   . Tachycardia   . Urinary problem in female   . Vitamin D deficiency     Past Surgical History:  Procedure Laterality Date  . ABDOMINAL HYSTERECTOMY  1998  . CYST REMOVAL HAND  1980's  . FOOT SURGERY Left 1980's  . [place]  . NASAL SINUS SURGERY    . ROTATOR CUFF REPAIR     left shoulder  . SHOULDER ARTHROSCOPY  03/02/2012   Procedure: ARTHROSCOPY SHOULDER;  Surgeon: Anna Pel, MD;  Location: [place];  Service: Orthopedics;   Laterality: Right;  Right shoulder diagnostic operative arthroscopy, rotator interval release manipulation    Current Medications: Current Meds  Medication Sig  . Acetaminophen (TYLENOL PO) Take by mouth as needed.  . Magnesium 400 MG CAPS Take 1 tablet by mouth daily.  . [DISCONTINUED] Vitamin D, Ergocalciferol, (DRISDOL) 1.25 MG (50000 UNIT) CAPS capsule Take 1 capsule (50,000 Units total) by mouth every 7 (seven) days.     Allergies:   Anesthetics, amide; Keflex [cephalexin]; Latex; Lorabid [loracarbef]; Peanut-containing drug products; Penicillins; Pork-derived products; Raspberry; Shellfish-derived products; and Strawberry extract   Social History   Socioeconomic History  . Marital status: Married    Spouse name: Anna Francis  . Number of children: Not on file  . Years of education: Not on file  . Highest education level: Not on file  Occupational History  . Occupation: Clinical cytogeneticist  Tobacco Use  . Smoking status: Never Smoker  . Smokeless tobacco: Never Used  Substance and Sexual Activity  . Alcohol use: No  . Drug use: No  . Sexual activity:  Not on file  Other Topics Concern  . Not on file  Social History Narrative  . Not on file   Social Determinants of Health   Financial Resource Strain:   . Difficulty of Paying Living Expenses:   Food Insecurity:   . Worried About Charity fundraiser in the Last Year:   . Arboriculturist in the Last Year:   Transportation Needs:   . Film/video editor (Medical):   Marland Kitchen Lack of Transportation (Non-Medical):   Physical Activity:   . Days of Exercise per Week:   . Minutes of Exercise per Session:   Stress:   . Feeling of Stress :   Social Connections:   . Frequency of Communication with Friends and Family:   . Frequency of Social Gatherings with Friends and Family:   . Attends Religious Services:   . Active Member of Clubs or Organizations:   . Attends Archivist Meetings:   Marland Kitchen Marital Status:      Family  History: The patient's family history includes Alcoholism in her mother; Cancer in her mother; Diabetes in her father and mother; Heart disease in her mother; Hyperlipidemia in her father; Hypertension in her father and mother; Liver disease in her mother; Obesity in her mother; Sleep apnea in her mother; Stroke in her father; Thyroid disease in her mother.  ROS:   Please see the history of present illness.     All other systems reviewed and are negative.  EKGs/Labs/Other Studies Reviewed:    The following studies were reviewed today:   EKG:   10/06/19  NSR at 64.  No ST or T wave changes.   Recent Labs: 10/06/2019: ALT 20; BUN 20; Creatinine, Ser 0.77; Hemoglobin 14.8; Platelets 324; Potassium 4.8; Sodium 139; TSH 2.990  Recent Lipid Panel    Component Value Date/Time   CHOL 221 (H) 10/06/2019 1336   TRIG 88 10/06/2019 1336   HDL 63 10/06/2019 1336   CHOLHDL 3.5 10/06/2019 1336   LDLCALC 143 (H) 10/06/2019 1336    Physical Exam:    VS:  BP 134/90 (BP Location: Left Arm) Comment: re checked per pts request in left arm az,cma  Pulse 78   Ht 5\' 2"  (1.575 m)   Wt 202 lb 12.8 oz (92 kg)   SpO2 99%   BMI 37.09 kg/m     Wt Readings from Last 3 Encounters:  10/28/19 202 lb 12.8 oz (92 kg)  10/20/19 203 lb (92.1 kg)  10/06/19 209 lb (94.8 kg)     GEN: middle age , moderately obese female,   NAD  HEENT: Normal NECK: No JVD; No carotid bruits LYMPHATICS: No lymphadenopathy CARDIAC: RRR, no murmurs, rubs, gallops RESPIRATORY:  Clear to auscultation without rales, wheezing or rhonchi  ABDOMEN: Soft, non-tender, non-distended MUSCULOSKELETAL:  No edema; No deformity  SKIN: Warm and dry NEUROLOGIC:  Alert and oriented x 3 PSYCHIATRIC:  Normal affect   ASSESSMENT:    1. Palpitations    PLAN:    In order of problems listed above:  1. Palpitations: Kathlen presents for further evaluation of palpitations.  She typically has these if she has become overheated or if she is  just been in a hot bath.  Cannot rule out an arrhythmia but this might just be sinus tachycardia.  I like to place a 30-day monitor on her for further evaluation.  I reminded her to keep hydrated especially in the heat.  We will see her again in 3  months for follow-up visit.   Medication Adjustments/Labs and Tests Ordered: Current medicines are reviewed at length with the patient today.  Concerns regarding medicines are outlined above.  Orders Placed This Encounter  Procedures  . CARDIAC EVENT MONITOR   No orders of the defined types were placed in this encounter.   Patient Instructions  Medication Instructions:  Your provider recommends that you continue on your current medications as directed. Please refer to the Current Medication list given to you today.   *If you need a refill on your cardiac medications before your next appointment, please call your pharmacy*  Testing/Procedures: Your physician has recommended that you wear an event monitor. Event monitors are medical devices that record the heart's electrical activity. Doctors most often [place] these monitors to diagnose arrhythmias. Arrhythmias are problems with the speed or rhythm of the heartbeat. The monitor is a small, portable device. You can wear one while you do your normal daily activities. This is usually used to diagnose what is causing palpitations/syncope (passing out). This will be mailed to your home.  Follow-Up: Your provider recommends that you schedule a follow-up appointment in 3 months with Dr. Acie Fredrickson.  [employer] Cardiac Event Monitor Instructions Your physician has requested you wear your cardiac event monitor for 30 days. [employer] may call or text to confirm a shipping address. The monitor will be sent to a land address via UPS. [employer] will not ship a monitor to a PO BOX. It typically takes 3-5 days to receive your monitor after it has been enrolled. [employer] will assist with USPS tracking if your  package is delayed. The telephone number for [employer] is [phone]. Once you have received your monitor, please review the enclosed instructions. Instruction tutorials can also be viewed under help and settings on the enclosed cell phone. Your monitor has already been registered assigning a specific monitor serial # to you.  Applying the monitor Remove cell phone from case and turn it on. The cell phone works as Dealer and needs to be within [employer] of you at all times. The cell phone will need to be charged on a daily basis. We recommend you plug the cell phone into the enclosed charger at your bedside table every night.  Monitor batteries: You will receive two monitor batteries labelled #1 and #2. These are your recorders. Plug battery #2 onto the second connection on the enclosed charger. Keep one battery on the charger at all times. This will keep the monitor battery deactivated. It will also keep it fully charged for when you need to switch your monitor batteries. A small light will be blinking on the battery emblem when it is charging. The light on the battery emblem will remain on when the battery is fully charged.  Open package of a Monitor strip. Insert battery #1 into black hood on strip and gently squeeze monitor battery onto connection as indicated in instruction booklet. Set aside while preparing skin.  Choose location for your strip, vertical or horizontal, as indicated in the instruction booklet. Shave to remove all hair from location. There cannot be any lotions, oils, powders, or colognes on skin where monitor is to be applied. Wipe skin clean with enclosed Saline wipe. Dry skin completely.  Peel paper labeled #1 off the back of the Monitor strip exposing the adhesive. Place the monitor on the chest in the vertical or horizontal position shown in the instruction booklet. One arrow on the monitor strip must be pointing upward. Carefully remove paper labeled  #  2, attaching remainder of strip to your skin. Try not to create any folds or wrinkles in the strip as you apply it.  Firmly press and release the circle in the center of the monitor battery. You will hear a small beep. This is turning the monitor battery on. The heart emblem on the monitor battery will light up every 5 seconds if the monitor battery in turned on and connected to the patient securely. Do not push and hold the circle down as this turns the monitor battery off. The cell phone will locate the monitor battery. A screen will appear on the cell phone checking the connection of your monitor strip. This may read poor connection initially but change to good connection within the next minute. Once your monitor accepts the connection you will hear a series of 3 beeps followed by a climbing crescendo of beeps. A screen will appear on the cell phone showing the two monitor strip placement options. Touch the picture that demonstrates where you applied the monitor strip.  Your monitor strip and battery are waterproof. You are able to shower, bathe, or swim with the monitor on. They just ask you do not submerge deeper than 3 feet underwater. We recommend removing the monitor if you are swimming in a lake, river, or ocean.  Your monitor battery will need to be switched to a fully charged monitor battery approximately once a week. The cell phone will alert you of an action which needs to be made.  On the cell phone, tap for details to reveal connection status, monitor battery status, and cell phone battery status. The green dots indicates your monitor is in good status. A red dot indicates there is something that needs your attention.  To record a symptom, click the circle on the monitor battery. In 30-60 seconds a list of symptoms will appear on the cell phone. Select your symptom and tap save. Your monitor will record a sustained or significant arrhythmia regardless of you clicking the  button. Some patients do not feel the heart rhythm irregularities. [employer] will notify us of any serious or critical events.  Refer to instruction booklet for instructions on switching batteries, changing strips, the Do not disturb or Pause features, or any additional questions.  Call [employer] at [phone], to confirm your monitor is transmitting and record your baseline. They will answer any questions you may have regarding the monitor instructions at that time.  Returning the monitor to [place] all equipment back into blue box. Peel off strip of paper to expose adhesive and close box securely. There is a prepaid UPS shipping label on this box. Drop in a UPS drop box, or at a UPS facility like [employer]. You may also contact [employer] to arrange UPS to pick up monitor package at your home.     Signed, Mertie Moores, MD  10/28/2019 11:26 AM    [facility]

## 2019-10-28 NOTE — Telephone Encounter (Signed)
Enrolled patient for a 30 day [employer] Event Monitor to be mailed to patients home  

## 2019-10-28 NOTE — Patient Instructions (Addendum)
Medication Instructions:  Your provider recommends that you continue on your current medications as directed. Please refer to the Current Medication list given to you today.   *If you need a refill on your cardiac medications before your next appointment, please call your pharmacy*  Testing/Procedures: Your physician has recommended that you wear an event monitor. Event monitors are medical devices that record the heart's electrical activity. Doctors most often [place] these monitors to diagnose arrhythmias. Arrhythmias are problems with the speed or rhythm of the heartbeat. The monitor is a small, portable device. You can wear one while you do your normal daily activities. This is usually used to diagnose what is causing palpitations/syncope (passing out). This will be mailed to your home.  Follow-Up: Your provider recommends that you schedule a follow-up appointment in 3 months with Dr. Acie Fredrickson.  [employer] Cardiac Event Monitor Instructions Your physician has requested you wear your cardiac event monitor for 30 days. [employer] may call or text to confirm a shipping address. The monitor will be sent to a land address via UPS. [employer] will not ship a monitor to a PO BOX. It typically takes 3-5 days to receive your monitor after it has been enrolled. [employer] will assist with USPS tracking if your package is delayed. The telephone number for [employer] is [phone]. Once you have received your monitor, please review the enclosed instructions. Instruction tutorials can also be viewed under help and settings on the enclosed cell phone. Your monitor has already been registered assigning a specific monitor serial # to you.  Applying the monitor Remove cell phone from case and turn it on. The cell phone works as Dealer and needs to be within [employer] of you at all times. The cell phone will need to be charged on a daily basis. We recommend you plug the cell phone into the enclosed  charger at your bedside table every night.  Monitor batteries: You will receive two monitor batteries labelled #1 and #2. These are your recorders. Plug battery #2 onto the second connection on the enclosed charger. Keep one battery on the charger at all times. This will keep the monitor battery deactivated. It will also keep it fully charged for when you need to switch your monitor batteries. A small light will be blinking on the battery emblem when it is charging. The light on the battery emblem will remain on when the battery is fully charged.  Open package of a Monitor strip. Insert battery #1 into black hood on strip and gently squeeze monitor battery onto connection as indicated in instruction booklet. Set aside while preparing skin.  Choose location for your strip, vertical or horizontal, as indicated in the instruction booklet. Shave to remove all hair from location. There cannot be any lotions, oils, powders, or colognes on skin where monitor is to be applied. Wipe skin clean with enclosed Saline wipe. Dry skin completely.  Peel paper labeled #1 off the back of the Monitor strip exposing the adhesive. Place the monitor on the chest in the vertical or horizontal position shown in the instruction booklet. One arrow on the monitor strip must be pointing upward. Carefully remove paper labeled #2, attaching remainder of strip to your skin. Try not to create any folds or wrinkles in the strip as you apply it.  Firmly press and release the circle in the center of the monitor battery. You will hear a small beep. This is turning the monitor battery on. The heart emblem on the monitor battery will  light up every 5 seconds if the monitor battery in turned on and connected to the patient securely. Do not push and hold the circle down as this turns the monitor battery off. The cell phone will locate the monitor battery. A screen will appear on the cell phone checking the connection of your  monitor strip. This may read poor connection initially but change to good connection within the next minute. Once your monitor accepts the connection you will hear a series of 3 beeps followed by a climbing crescendo of beeps. A screen will appear on the cell phone showing the two monitor strip placement options. Touch the picture that demonstrates where you applied the monitor strip.  Your monitor strip and battery are waterproof. You are able to shower, bathe, or swim with the monitor on. They just ask you do not submerge deeper than 3 feet underwater. We recommend removing the monitor if you are swimming in a lake, river, or ocean.  Your monitor battery will need to be switched to a fully charged monitor battery approximately once a week. The cell phone will alert you of an action which needs to be made.  On the cell phone, tap for details to reveal connection status, monitor battery status, and cell phone battery status. The green dots indicates your monitor is in good status. A red dot indicates there is something that needs your attention.  To record a symptom, click the circle on the monitor battery. In 30-60 seconds a list of symptoms will appear on the cell phone. Select your symptom and tap save. Your monitor will record a sustained or significant arrhythmia regardless of you clicking the button. Some patients do not feel the heart rhythm irregularities. [employer] will notify us of any serious or critical events.  Refer to instruction booklet for instructions on switching batteries, changing strips, the Do not disturb or Pause features, or any additional questions.  Call [employer] at [phone], to confirm your monitor is transmitting and record your baseline. They will answer any questions you may have regarding the monitor instructions at that time.  Returning the monitor to [place] all equipment back into blue box. Peel off strip of paper to expose adhesive and  close box securely. There is a prepaid UPS shipping label on this box. Drop in a UPS drop box, or at a UPS facility like [employer]. You may also contact [employer] to arrange UPS to pick up monitor package at your home.

## 2019-11-04 ENCOUNTER — Encounter (INDEPENDENT_AMBULATORY_CARE_PROVIDER_SITE_OTHER): Payer: PPO

## 2019-11-04 DIAGNOSIS — R002 Palpitations: Secondary | ICD-10-CM

## 2019-11-07 ENCOUNTER — Ambulatory Visit (INDEPENDENT_AMBULATORY_CARE_PROVIDER_SITE_OTHER): Payer: PPO | Admitting: Family Medicine

## 2019-11-16 ENCOUNTER — Ambulatory Visit (INDEPENDENT_AMBULATORY_CARE_PROVIDER_SITE_OTHER): Payer: PPO | Admitting: Family Medicine

## 2019-11-16 ENCOUNTER — Other Ambulatory Visit: Payer: Self-pay

## 2019-11-16 ENCOUNTER — Encounter (INDEPENDENT_AMBULATORY_CARE_PROVIDER_SITE_OTHER): Payer: Self-pay | Admitting: Family Medicine

## 2019-11-16 VITALS — BP 118/82 | HR 64 | Temp 98.3°F | Ht 62.0 in | Wt 197.0 lb

## 2019-11-16 DIAGNOSIS — E7849 Other hyperlipidemia: Secondary | ICD-10-CM

## 2019-11-16 DIAGNOSIS — E559 Vitamin D deficiency, unspecified: Secondary | ICD-10-CM

## 2019-11-16 DIAGNOSIS — Z6836 Body mass index (BMI) 36.0-36.9, adult: Secondary | ICD-10-CM | POA: Diagnosis not present

## 2019-11-16 DIAGNOSIS — R7303 Prediabetes: Secondary | ICD-10-CM

## 2019-11-16 MED ORDER — VITAMIN D (ERGOCALCIFEROL) 1.25 MG (50000 UNIT) PO CAPS: 50000.0000 [IU] | ORAL_CAPSULE | ORAL | 0 refills | Status: DC

## 2019-11-16 NOTE — Patient Instructions (Signed)
The 10-year ASCVD risk score Mikey Bussing DC Brooke Bonito., et al., 2013) is: 5.9%   Values used to calculate the score:     Age: 69 years     Sex: Female     Is Non-Hispanic African American: No     Diabetic: No     Tobacco smoker: No     Systolic Blood Pressure: 041 mmHg     Is BP treated: No     HDL Cholesterol: 63 mg/dL     Total Cholesterol: 221 mg/dL

## 2019-11-17 NOTE — Progress Notes (Signed)
Chief Complaint:   OBESITY Anna Francis is here to discuss her progress with her obesity treatment plan along with follow-up of her obesity related diagnoses. Anna Francis is on the Category 1 Plan and states she is following her eating plan approximately 100% of the time. Anna Francis states she is exercising for 0 minutes 0 times per week.  Today's visit was #: 3 Starting weight: 209 lbs Starting date: 10/06/2019 Today's weight: 197 lbs Today's date: 11/16/2019 Total lbs lost to date: 12 lbs Total lbs lost since last in-office visit: 6 lbs  Interim History: Anna Francis's husband has had COVID since her last office visit.  He received infusions and she was tested and was negative.  She ordered groceries online.  She has been sleeping poorly because she has been on the couch as she has been isolating herself from him.  Also, she is bored because she is trying to only eat what her husband can due to his CHF.  She states she will not bring in foods he cannot eat and she is determined to "keep him healthy".  Subjective:   1. Prediabetes Anna Francis has a diagnosis of prediabetes based on her elevated HgA1c and was informed this puts her at greater risk of developing diabetes. She continues to work on diet and exercise to decrease her risk of diabetes. She denies nausea or hypoglycemia.  Lab Results  Component Value Date   HGBA1C 5.6 10/06/2019   Lab Results  Component Value Date   INSULIN 40.3 (H) 10/06/2019   2. Other hyperlipidemia Anna Francis has hyperlipidemia and has been trying to improve her cholesterol levels with intensive lifestyle modification including a low saturated fat diet, exercise and weight loss. She denies any chest pain, claudication or myalgias.  She was going to follow-up with her PCP, Dr. Nevada Crane, for recheck and management of her elevated LDL.  Lab Results  Component Value Date   ALT 20 10/06/2019   AST 16 10/06/2019   ALKPHOS 87 10/06/2019   BILITOT 0.3 10/06/2019   Lab Results    Component Value Date   CHOL 221 (H) 10/06/2019   HDL 63 10/06/2019   LDLCALC 143 (H) 10/06/2019   TRIG 88 10/06/2019   CHOLHDL 3.5 10/06/2019   3. Vitamin D deficiency Anna Francis Vitamin D level was 20.4 on 10/06/2019. She is currently taking prescription vitamin D 50,000 IU each week. She denies nausea, vomiting or muscle weakness.  Assessment/Plan:   1. Prediabetes Anna Francis will continue to work on weight loss, exercise, and decreasing simple carbohydrates to help decrease the risk of diabetes.  Continue prudent nutritional plan, weight loss, recheck labs 3 months from now.  2. Other hyperlipidemia Cardiovascular risk and specific lipid/LDL goals reviewed.  We discussed several lifestyle modifications today and Anna Francis will continue to work on diet, exercise and weight loss efforts. Orders and follow up as documented in patient record.  ASCVD risk is 1.8%.  She will discuss with her PCP whether or not medication is warranted.  Continue prudent nutritional plan, weight loss.  Recheck labs in 3-4 months.  Counseling Intensive lifestyle modifications are the first line treatment for this issue. . Dietary changes: Increase soluble fiber. Decrease simple carbohydrates. . Exercise changes: Moderate to vigorous-intensity aerobic activity 150 minutes per week if tolerated. . Lipid-lowering medications: see documented in medical record.  3. Vitamin D deficiency Low Vitamin D level contributes to fatigue and are associated with obesity, breast, and colon cancer. She agrees to continue to take prescription Vitamin D @50 ,000 IU  every week.  Stop OTC vitamin D.  Recheck labs in 3 months.  -Refill Vitamin D, Ergocalciferol, (DRISDOL) 1.25 MG (50000 UNIT) CAPS capsule; Take 1 capsule (50,000 Units total) by mouth every 7 (seven) days.  Dispense: 4 capsule; Refill: 0  4. Class 2 severe obesity with serious comorbidity and body mass index (BMI) of 36.0 to 36.9 in adult, unspecified obesity type  (HCC) Anna Francis is currently in the action stage of change. As such, her goal is to continue with weight loss efforts. She has agreed to the Category 1 Plan.   Exercise goals: As is.  Behavioral modification strategies: recipe options discussed with and given to her, increasing lean protein intake, meal planning and cooking strategies and planning for success.  Anna Francis has agreed to follow-up with our clinic in 2 weeks. She was informed of the importance of frequent follow-up visits to maximize her success with intensive lifestyle modifications for her multiple health conditions.   Objective:   Blood pressure 118/82, pulse 64, temperature 98.3 F (36.8 C), height 5\' 2"  (1.575 m), weight 197 lb (89.4 kg), SpO2 98 %. Body mass index is 36.03 kg/m.  General: Cooperative, alert, well developed, in no acute distress. HEENT: Conjunctivae and lids unremarkable. Cardiovascular: Regular rhythm.  Lungs: Normal work of breathing. Neurologic: No focal deficits.   Lab Results  Component Value Date   CREATININE 0.77 10/06/2019   BUN 20 10/06/2019   NA 139 10/06/2019   K 4.8 10/06/2019   CL 99 10/06/2019   CO2 24 10/06/2019   Lab Results  Component Value Date   ALT 20 10/06/2019   AST 16 10/06/2019   ALKPHOS 87 10/06/2019   BILITOT 0.3 10/06/2019   Lab Results  Component Value Date   HGBA1C 5.6 10/06/2019   Lab Results  Component Value Date   INSULIN 40.3 (H) 10/06/2019   Lab Results  Component Value Date   TSH 2.990 10/06/2019   Lab Results  Component Value Date   CHOL 221 (H) 10/06/2019   HDL 63 10/06/2019   LDLCALC 143 (H) 10/06/2019   TRIG 88 10/06/2019   CHOLHDL 3.5 10/06/2019   Lab Results  Component Value Date   WBC 9.5 10/06/2019   HGB 14.8 10/06/2019   HCT 45.1 10/06/2019   MCV 91 10/06/2019   PLT 324 10/06/2019   Obesity Behavioral Intervention Documentation for Insurance:   Approximately 15 minutes were spent on the discussion below.  ASK: We discussed  the diagnosis of obesity with Roizy today and Anna Francis agreed to give [place] permission to discuss obesity behavioral modification therapy today.  ASSESS: Anna Francis has the diagnosis of obesity and her BMI today is 36.1. Alandria is in the action stage of change.   ADVISE: Anna Francis was educated on the multiple health risks of obesity as well as the benefit of weight loss to improve her health. She was advised of the need for long term treatment and the importance of lifestyle modifications to improve her current health and to decrease her risk of future health problems.  AGREE: Multiple dietary modification options and treatment options were discussed and Anna Francis agreed to follow the recommendations documented in the above note.  ARRANGE: Anna Francis was educated on the importance of frequent visits to treat obesity as outlined per CMS and USPSTF guidelines and agreed to schedule her next follow up appointment today.  Attestation Statements:   Reviewed by clinician on day of visit: allergies, medications, problem list, medical history, surgical history, family history, social history, and previous  encounter notes.  I, Water quality scientist, CMA, am acting as Location manager for [employer], DO.  I have reviewed the above documentation for accuracy and completeness, and I agree with the above. Mellody Dance, DO

## 2019-11-23 DIAGNOSIS — M25551 Pain in right hip: Secondary | ICD-10-CM | POA: Diagnosis not present

## 2019-11-23 DIAGNOSIS — G4730 Sleep apnea, unspecified: Secondary | ICD-10-CM | POA: Diagnosis not present

## 2019-11-23 DIAGNOSIS — N3001 Acute cystitis with hematuria: Secondary | ICD-10-CM | POA: Diagnosis not present

## 2019-11-23 DIAGNOSIS — I10 Essential (primary) hypertension: Secondary | ICD-10-CM | POA: Diagnosis not present

## 2019-11-29 DIAGNOSIS — R350 Frequency of micturition: Secondary | ICD-10-CM | POA: Diagnosis not present

## 2019-11-29 DIAGNOSIS — Z0001 Encounter for general adult medical examination with abnormal findings: Secondary | ICD-10-CM | POA: Diagnosis not present

## 2019-11-29 DIAGNOSIS — N390 Urinary tract infection, site not specified: Secondary | ICD-10-CM | POA: Diagnosis not present

## 2019-11-29 DIAGNOSIS — Z90710 Acquired absence of both cervix and uterus: Secondary | ICD-10-CM | POA: Diagnosis not present

## 2019-11-29 DIAGNOSIS — R102 Pelvic and perineal pain: Secondary | ICD-10-CM | POA: Diagnosis not present

## 2019-11-29 DIAGNOSIS — K581 Irritable bowel syndrome with constipation: Secondary | ICD-10-CM | POA: Diagnosis not present

## 2019-11-29 DIAGNOSIS — Z91018 Allergy to other foods: Secondary | ICD-10-CM | POA: Diagnosis not present

## 2019-11-29 DIAGNOSIS — K580 Irritable bowel syndrome with diarrhea: Secondary | ICD-10-CM | POA: Diagnosis not present

## 2019-11-29 DIAGNOSIS — E782 Mixed hyperlipidemia: Secondary | ICD-10-CM | POA: Diagnosis not present

## 2019-11-29 DIAGNOSIS — R319 Hematuria, unspecified: Secondary | ICD-10-CM | POA: Diagnosis not present

## 2019-11-29 DIAGNOSIS — I10 Essential (primary) hypertension: Secondary | ICD-10-CM | POA: Diagnosis not present

## 2019-11-30 ENCOUNTER — Ambulatory Visit (INDEPENDENT_AMBULATORY_CARE_PROVIDER_SITE_OTHER): Payer: PPO | Admitting: Family Medicine

## 2019-11-30 DIAGNOSIS — I10 Essential (primary) hypertension: Secondary | ICD-10-CM | POA: Diagnosis not present

## 2019-11-30 DIAGNOSIS — M25551 Pain in right hip: Secondary | ICD-10-CM | POA: Diagnosis not present

## 2019-11-30 DIAGNOSIS — G4730 Sleep apnea, unspecified: Secondary | ICD-10-CM | POA: Diagnosis not present

## 2019-11-30 DIAGNOSIS — N3001 Acute cystitis with hematuria: Secondary | ICD-10-CM | POA: Diagnosis not present

## 2020-01-02 DIAGNOSIS — N3001 Acute cystitis with hematuria: Secondary | ICD-10-CM | POA: Diagnosis not present

## 2020-01-05 DIAGNOSIS — Z1231 Encounter for screening mammogram for malignant neoplasm of breast: Secondary | ICD-10-CM | POA: Diagnosis not present

## 2020-01-13 ENCOUNTER — Encounter: Payer: Self-pay | Admitting: Allergy & Immunology

## 2020-01-13 ENCOUNTER — Other Ambulatory Visit: Payer: Self-pay

## 2020-01-13 ENCOUNTER — Ambulatory Visit (INDEPENDENT_AMBULATORY_CARE_PROVIDER_SITE_OTHER): Payer: PPO | Admitting: Allergy & Immunology

## 2020-01-13 VITALS — BP 138/88 | HR 60 | Temp 98.1°F | Resp 18 | Ht 62.8 in | Wt 207.0 lb

## 2020-01-13 DIAGNOSIS — K9049 Malabsorption due to intolerance, not elsewhere classified: Secondary | ICD-10-CM

## 2020-01-13 DIAGNOSIS — T7840XD Allergy, unspecified, subsequent encounter: Secondary | ICD-10-CM | POA: Diagnosis not present

## 2020-01-13 DIAGNOSIS — L253 Unspecified contact dermatitis due to other chemical products: Secondary | ICD-10-CM

## 2020-01-13 NOTE — Progress Notes (Signed)
NEW PATIENT  Date of Service/Encounter:  01/13/20  Referring provider: Celene Squibb, MD   Assessment:   Food intolerance - with negative testing to the entire panel  Allergic reaction  Possible contact dermatitis - with tongue fissuring  History of wheezing - improved with allergy shots  History of allergic rhinitis - improved with allergy shots   Anna Francis is a delightful talkative 69 year old presenting for evaluation of possible food allergies.  Her history is rather all over the place and not indicative of a true IgE mediated reaction.  Our testing confirms that this is not IgE mediated.  We are getting some labs to rule out serious causes of reactions such as hives and swelling, although it should be noted that her episodes are not always associated with hives and/or swelling.  I also think it might be useful to go ahead and do patch testing to see if she has some kind of contact dermatitis to fragrance or some other environmental chemical since her main complaint seems to be centered on the fissuring of her tongue.  I do not think that any of her reactions are related to the COVID-19 vaccine.  This was all just a timing issue.  Plan/Recommendations:   1. Food intolerance - Testing to the entire food panel was completely negative. - Copy fo test results provided. - There is a the low positive predictive value of food allergy testing and hence the high possibility of false positives. - In contrast, food allergy testing has a high negative predictive value, therefore if testing is negative we can be relatively assured that they are indeed negative.  - Continue to monitor any triggers.  - This might be related to food additives, but these are much more difficult to figure out.  - I would definitely go see Dr. Paulita Fujita again.   2. Allergic reaction - Your history is confusing. - We will get some labs to rule out serious causes of hives: complete blood count, tryptase level, CMP,  ESR, and CRP. - Chronic hives are often times a self limited process and will "burn themselves out" over 6-12 months, although this is not always the case.  - In the meantime, start suppressive dosing of antihistamines:   - Morning: Zyrtec (cetirizine) 60m (one tablet)  - Evening: Zyrtec (cetirizine) 181m(one tablet) - You can change this dosing at home, decreasing the dose as needed or increasing the dosing as needed.  - If you are not tolerating the medications or are tired of taking them every day, we can start treatment with a monthly injectable medication called Xolair. - Xolair can help with preventing/decreasing allergic reactions.   3. Return in about 3 months (around 04/14/2020).   Subjective:   Anna Francis a [age].o. female presenting today for evaluation of  Chief Complaint  Patient presents with  . Pruritis    tongue itching, swelling, cracking, burning. frequent UTIs and heavy blood in urine. believes it is red dye related. no breathing difficulties reported.     Anna Francis has a history of the following: Patient Active Problem List   Diagnosis Date Noted  . Palpitations 10/28/2019  . HTN (hypertension) 10/28/2019    History obtained from: chart review and patient.  Anna Francis was referred by HaCelene SquibbMD.     Anna Francis a [age].o. female presenting for an evaluation of food allergies as well as tongue fissuring.   She has had these issues for decades.  She had testing done in 2005 and she had "blood and swabbing testing". Apparently she had swabs done in her mouth and she had "pahges" of foods that she was supposed to ignore and avoid. She lists a whole list of items. She does report pork always gave her stomach pain. She does not have this testing from 2005.   Since getting the COVID vaccine, she can tolerate pork and now all of the fish are bothering her. Before she made the appointment with [place], she did an [place] test. Apparently  her blood was "too thick" to get out of her finger. She had "93% allergies" to everything including chicken.  I am rather unclear what these results communicate as she does not have any copies of the testing.  Prior to the vaccine, she was avoiding pork and "red stuff". She reports that her tongue stays blistered. She cannot stand to eat since her tongue is so sore. She has difficulty eating for three years.  She reports that she does not eat anything.   She did see Dr. Neldon Mc at some point and got allergy shots and asthma. Asthma seems to have bene triggered by her workplace at the time. Her husband gave her the shots (he is a Chief Strategy Officer apparently and does see [place]). Breathing is now totally fine. She wa son an every day inhaler but she cannot remember the name of it. The last time that she had wheezing troubles was in the early 2000s. 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    Her main complaint today is regarding her tongue fissuring.  She calls them blisters, but on exam it seems to be more just fissures within the entire itself.  They did seem to get worse after a root canal and some other dental work.  She also tried to change her toothpaste without much improvement in her symptoms.  She has never undergone patch testing.  She saw GI and workup was unrevealing. She has seen Dr. Sharlett Iles as well as a Dr. Ames Dura, although she notes that Dr. Satira Mccallum is retired. Now she sees Dr. Paulita Fujita for gastroenterology. Endoscopy was ages ago and she thinks it was normal. She has regular colonoscopies.   Otherwise, there is no history of other atopic diseases, including drug allergies, stinging insect allergies or contact dermatitis. There is no significant infectious history. Vaccinations are up to date.    Past Medical History: Patient Active Problem List   Diagnosis Date Noted  . Palpitations 10/28/2019  . HTN (hypertension) 10/28/2019    Medication List:  Allergies as of  01/13/2020      Reactions   Anesthetics, Amide Nausea Only   Encainide Nausea Only   Keflex [cephalexin]    Latex Other (See Comments)   blisters   Lorabid [loracarbef] Itching, Other (See Comments)   bleeding   Peanut-containing Drug Products Other (See Comments)   GI upset   Penicillins Itching   Pork-derived Products Other (See Comments)   Sore throat   Raspberry Other (See Comments)   redness   Shellfish-derived Products Other (See Comments)   Sore throat   Other Rash   Strawberry Extract Rash      Medication List       Accurate as of January 13, 2020  3:03 PM. If you have any questions, ask your nurse or doctor.        Magnesium 400 MG Caps Take 1 tablet by mouth daily.   TYLENOL PO Take  by mouth as needed.   Vitamin D (Ergocalciferol) 1.25 MG (50000 UNIT) Caps capsule Commonly known as: DRISDOL Take 1 capsule (50,000 Units total) by mouth every 7 (seven) days.       Birth History: non-contributory  Developmental History: non-contributory  Past Surgical History: Past Surgical History:  Procedure Laterality Date  . ABDOMINAL HYSTERECTOMY  1998  . CYST REMOVAL HAND  1980's  . FOOT SURGERY Left 1980's  . [facility]  . NASAL SINUS SURGERY    . ROTATOR CUFF REPAIR     left shoulder  . SHOULDER ARTHROSCOPY  03/02/2012   Procedure: ARTHROSCOPY SHOULDER;  Surgeon: Meredith Pel, MD;  Location: [facility];  Service: Orthopedics;  Laterality: Right;  Right shoulder diagnostic operative arthroscopy, rotator interval release manipulation     Family History: Family History  Problem Relation Age of Onset  . Diabetes Mother   . Hypertension Mother   . Heart disease Mother   . Thyroid disease Mother   . Cancer Mother   . Liver disease Mother   . Sleep apnea Mother   . Alcoholism Mother   . Obesity Mother   . Allergy (severe) Mother        latex, antibiotics  . Diabetes Father   . Hypertension Father   . Hyperlipidemia Father   .  Stroke Father   . Allergy (severe) Father        foods and medications     Social History: [place] lives at home with her family. She lives in a house with hardwood and area rugs throughout the home.  There are no animals inside or outside of the home.  She does have dust mite covers on her bed, but not her pillows.  There is no tobacco exposure.  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   Review of Systems  Constitutional: Negative.  Negative for chills, fever, malaise/fatigue and weight loss.  HENT: Negative for congestion, ear discharge and ear pain.   Eyes: Negative for pain, discharge and redness.  Respiratory: Negative for cough, sputum production, shortness of breath and wheezing.   Cardiovascular: Negative.  Negative for chest pain and palpitations.  Gastrointestinal: Negative for abdominal pain, constipation, diarrhea, heartburn, nausea and vomiting.  Skin: Negative.  Negative for itching and rash.  Neurological: Negative for dizziness and headaches.  Endo/Heme/Allergies: Positive for environmental allergies. Does not bruise/bleed easily.       Positive for perceived food allergies.       Objective:   Blood pressure 138/88, pulse 60, temperature 98.1 F (36.7 C), temperature source Temporal, resp. rate 18, height 5' 2.8" (1.595 m), weight 207 lb (93.9 kg), SpO2 97 %. Body mass index is 36.91 kg/m.   Physical Exam:   Physical Exam Constitutional:      Appearance: She is well-developed.     Comments: Pleasant female.  Cooperative with the exam.  HENT:     Head: Normocephalic and atraumatic.     Right Ear: Tympanic membrane, ear canal and external ear normal. No drainage, swelling or tenderness. Tympanic membrane is not injected, scarred, erythematous, retracted or bulging.     Left Ear: Tympanic membrane, ear canal and external ear normal. No drainage, swelling or tenderness. Tympanic membrane is not injected, scarred,  erythematous, retracted or bulging.     Nose: No nasal deformity, septal deviation, mucosal edema or rhinorrhea.     Right Turbinates: Enlarged and swollen.     Left  Turbinates: Enlarged and swollen.     Right Sinus: No maxillary sinus tenderness or frontal sinus tenderness.     Left Sinus: No maxillary sinus tenderness or frontal sinus tenderness.     Comments: There is some clear rhinorrhea present.    Mouth/Throat:     Mouth: Mucous membranes are not pale and not dry.     Pharynx: Uvula midline.  Eyes:     General:        Right eye: No discharge.        Left eye: No discharge.     Conjunctiva/sclera: Conjunctivae normal.     Right eye: Right conjunctiva is not injected. No chemosis.    Left eye: Left conjunctiva is not injected. No chemosis.    Pupils: Pupils are equal, round, and reactive to light.  Cardiovascular:     Rate and Rhythm: Normal rate and regular rhythm.     Heart sounds: Normal heart sounds.  Pulmonary:     Effort: Pulmonary effort is normal. No tachypnea, accessory muscle usage or respiratory distress.     Breath sounds: Normal breath sounds. No wheezing, rhonchi or rales.     Comments: Moving air well in all lung fields.  No increased work of breathing. Chest:     Chest wall: No tenderness.  Abdominal:     Tenderness: There is no abdominal tenderness. There is no guarding or rebound.  Lymphadenopathy:     Head:     Right side of head: No submandibular, tonsillar or occipital adenopathy.     Left side of head: No submandibular, tonsillar or occipital adenopathy.     Cervical: No cervical adenopathy.  Skin:    General: Skin is warm.     Capillary Refill: Capillary refill takes less than 2 seconds.     Coloration: Skin is not pale.     Findings: No abrasion, erythema, petechiae or rash. Rash is not papular, urticarial or vesicular.     Comments: No eczematous or urticarial lesions noted.  Neurological:     Mental Status: She is alert.      Diagnostic  studies:   Allergy Studies:     Food Adult Perc - 01/13/20 0900    Time Antigen Placed 0935    Allergen Manufacturer Lavella Hammock    Location Back    Number of allergen test 74     Control-buffer 50% Glycerol Negative    Control-Histamine 1 mg/ml 2+    1. Peanut Negative    2. Soybean Negative    3. Wheat Negative    4. Sesame Negative    5. Milk, cow Negative    6. Egg White, Chicken Negative    7. Casein Negative    8. Shellfish Mix Negative    9. Fish Mix Negative    10. Cashew Negative    11. Pecan Food Negative    12. Omega Negative    13. Almond Negative    14. Hazelnut Negative    15. [place] nut Negative    16. Coconut Negative    17. Pistachio Negative    18. Catfish Negative    19. Bass Negative    20. Trout Negative    21. Tuna Negative    22. Salmon Negative    23. Flounder Negative    24. Codfish Negative    25. Shrimp Negative    26. Crab Negative    27. Lobster Negative    28. Oyster Negative    29. Scallops Negative  30. Barley Negative    31. Oat  Negative    32. Rye  Negative    33. Hops Negative    34. Rice Negative    35. Cottonseed Negative    36. Saccharomyces Cerevisiae  Negative    37. Pork Negative    38. [place] Meat Negative    39. Chicken Meat Negative    40. Beef Negative    41. Lamb Negative    42. Tomato Negative    43. White Potato Negative    44. Sweet Potato Negative    45. Pea, Green/English Negative    46. Navy Bean Negative    47. Mushrooms Negative    48. Avocado Negative    49. Onion Negative    50. Cabbage Negative    51. Carrots Negative    52. Celery Negative    53. Corn Negative    54. Cucumber Negative    55. Grape (White seedless) Negative    56. Orange  Negative    57. Banana Negative    58. Apple Negative    59. Peach Negative    60. Strawberry Negative    61. Cantaloupe Negative    62. Watermelon Negative    63. Pineapple Negative    64. Chocolate/Cacao bean Negative    65. Karaya Gum Negative      66. Acacia (Arabic Gum) Negative    67. Cinnamon Negative    68. Nutmeg Negative    69. Ginger Negative    70. Garlic Negative    71. Pepper, black Negative    72. Mustard Negative           Allergy testing results were read and interpreted by myself, documented by clinical staff.         Salvatore Marvel, MD [facility]

## 2020-01-13 NOTE — Patient Instructions (Addendum)
1. Food intolerance - Testing to the entire food panel was completely negative. - Copy fo test results provided. - There is a the low positive predictive value of food allergy testing and hence the high possibility of false positives. - In contrast, food allergy testing has a high negative predictive value, therefore if testing is negative we can be relatively assured that they are indeed negative.  - Continue to monitor any triggers.  - This might be related to food additives, but these are much more difficult to figure out.  - I would definitely go see Dr. Paulita Fujita again.   2. Allergic reaction - Your history is confusing. - We will get some labs to rule out serious causes of hives: complete blood count, tryptase level, CMP, ESR, and CRP. - Chronic hives are often times a self limited process and will "burn themselves out" over 6-12 months, although this is not always the case.  - In the meantime, start suppressive dosing of antihistamines:   - Morning: Zyrtec (cetirizine) 43m (one tablet)  - Evening: Zyrtec (cetirizine) 175m(one tablet) - You can change this dosing at home, decreasing the dose as needed or increasing the dosing as needed.  - If you are not tolerating the medications or are tired of taking them every day, we can start treatment with a monthly injectable medication called Xolair. - Xolair can help with preventing/decreasing allergic reactions.   3. Return in about 3 months (around 04/14/2020).    Please inform usKoreaf any Emergency Department visits, hospitalizations, or changes in symptoms. Call usKoreaefore going to the ED for breathing or allergy symptoms since we might be able to fit you in for a sick visit. Feel free to contact usKoreanytime with any questions, problems, or concerns.  It was a pleasure to meet you today!  Websites that have reliable patient information: 1. American Academy of Asthma, Allergy, and Immunology: www.aaaai.org 2. Food Allergy Research and  Education (FARE): foodallergy.org 3. Mothers of Asthmatics: http://www.asthmacommunitynetwork.org 4. American College of Allergy, Asthma, and Immunology: www.acaai.org   COVID-19 Vaccine Information can be found at: [email or web address] questions related to vaccine distribution or appointments, please email [email or web address] or call [phone]    "Like" usK[facility] Facebook and Instagram for our latest updates!     HAPPY FALL!     Make sure you are registered to vote! If you have moved or changed any of your contact information, you will need to get this updated before voting!  In some cases, you MAY be able to register to vote online: [email or web address]

## 2020-01-15 LAB — ALPHA-GAL PANEL

## 2020-01-20 LAB — CMP14+EGFR
ALT: 27 IU/L (ref 0–32)
AST: 24 IU/L (ref 0–40)
Albumin/Globulin Ratio: 1.7 (ref 1.2–2.2)
Albumin: 4.4 g/dL (ref 3.8–4.8)
Alkaline Phosphatase: 93 IU/L (ref 44–121)
BUN/Creatinine Ratio: 17 (ref 12–28)
BUN: 17 mg/dL (ref 8–27)
Bilirubin Total: 0.4 mg/dL (ref 0.0–1.2)
CO2: 28 mmol/L (ref 20–29)
Calcium: 9.7 mg/dL (ref 8.7–10.3)
Chloride: 101 mmol/L (ref 96–106)
Creatinine, Ser: 0.98 mg/dL (ref 0.57–1.00)
GFR calc Af Amer: 69 mL/min/{1.73_m2} (ref >59)
GFR calc non Af Amer: 60 mL/min/{1.73_m2} (ref >59)
Globulin, Total: 2.6 g/dL (ref 1.5–4.5)
Glucose: 101 mg/dL — ABNORMAL HIGH (ref 65–99)
Potassium: 4.2 mmol/L (ref 3.5–5.2)
Sodium: 141 mmol/L (ref 134–144)
Total Protein: 7 g/dL (ref 6.0–8.5)

## 2020-01-20 LAB — CBC WITH DIFFERENTIAL
Basophils Absolute: 0.1 10*3/uL (ref 0.0–0.2)
Basos: 1 %
EOS (ABSOLUTE): 0.1 10*3/uL (ref 0.0–0.4)
Eos: 1 %
Hematocrit: 40.4 % (ref 34.0–46.6)
Hemoglobin: 13.6 g/dL (ref 11.1–15.9)
Immature Grans (Abs): 0 10*3/uL (ref 0.0–0.1)
Immature Granulocytes: 0 %
Lymphocytes Absolute: 3.2 10*3/uL — ABNORMAL HIGH (ref 0.7–3.1)
Lymphs: 32 %
MCH: 30.2 pg (ref 26.6–33.0)
MCHC: 33.7 g/dL (ref 31.5–35.7)
MCV: 90 fL (ref 79–97)
Monocytes Absolute: 0.7 10*3/uL (ref 0.1–0.9)
Monocytes: 7 %
Neutrophils Absolute: 5.7 10*3/uL (ref 1.4–7.0)
Neutrophils: 59 %
RBC: 4.51 x10E6/uL (ref 3.77–5.28)
RDW: 12.7 % (ref 11.7–15.4)
WBC: 9.8 10*3/uL (ref 3.4–10.8)

## 2020-01-20 LAB — C-REACTIVE PROTEIN: CRP: 9 mg/L (ref 0–10)

## 2020-01-20 LAB — ANA W/REFLEX IF POSITIVE: Anti Nuclear Antibody (ANA): NEGATIVE

## 2020-01-20 LAB — ALPHA-GAL PANEL
Alpha Gal IgE*: <0.1 kU/L (ref <0.10)
Beef (Bos spp) IgE: <0.1 kU/L (ref <0.35)
Class Interpretation: 0
Class Interpretation: 0
Class Interpretation: 0
Lamb/Mutton (Ovis spp) IgE: <0.1 kU/L (ref <0.35)
Pork (Sus spp) IgE: <0.1 kU/L (ref <0.35)

## 2020-01-20 LAB — ALLERGEN PROFILE, BASIC FOOD
Allergen Corn, IgE: <0.1 kU/L
Beef IgE: <0.1 kU/L
Chocolate/Cacao IgE: <0.1 kU/L
Egg, Whole IgE: <0.1 kU/L
Food Mix (Seafoods) IgE: NEGATIVE
Milk IgE: <0.1 kU/L
Peanut IgE: <0.1 kU/L
Pork IgE: <0.1 kU/L
Soybean IgE: <0.1 kU/L
Wheat IgE: <0.1 kU/L

## 2020-01-20 LAB — SEDIMENTATION RATE: Sed Rate: 19 mm/hr (ref 0–40)

## 2020-01-20 LAB — TRYPTASE: Tryptase: 13.8 ug/L — ABNORMAL HIGH (ref 2.2–13.2)

## 2020-01-31 ENCOUNTER — Ambulatory Visit: Payer: PPO | Admitting: Cardiovascular Disease

## 2020-02-22 ENCOUNTER — Other Ambulatory Visit: Payer: Self-pay

## 2020-02-22 ENCOUNTER — Ambulatory Visit (INDEPENDENT_AMBULATORY_CARE_PROVIDER_SITE_OTHER): Payer: PPO | Admitting: Urology

## 2020-02-22 ENCOUNTER — Encounter: Payer: Self-pay | Admitting: Urology

## 2020-02-22 VITALS — BP 119/75 | HR 71 | Temp 98.2°F | Ht 62.8 in | Wt 207.0 lb

## 2020-02-22 DIAGNOSIS — R310 Gross hematuria: Secondary | ICD-10-CM | POA: Diagnosis not present

## 2020-02-22 DIAGNOSIS — N3001 Acute cystitis with hematuria: Secondary | ICD-10-CM

## 2020-02-22 DIAGNOSIS — N390 Urinary tract infection, site not specified: Secondary | ICD-10-CM | POA: Diagnosis not present

## 2020-02-22 LAB — URINALYSIS, ROUTINE W REFLEX MICROSCOPIC
Bilirubin, UA: NEGATIVE
Glucose, UA: NEGATIVE
Ketones, UA: NEGATIVE
Leukocytes,UA: NEGATIVE
Nitrite, UA: NEGATIVE
Protein,UA: NEGATIVE
Specific Gravity, UA: 1.025 (ref 1.005–1.030)
Urobilinogen, Ur: 0.2 mg/dL (ref 0.2–1.0)
pH, UA: 7 (ref 5.0–7.5)

## 2020-02-22 LAB — MICROSCOPIC EXAMINATION: Renal Epithel, UA: NONE SEEN /hpf

## 2020-02-22 LAB — BLADDER SCAN AMB NON-IMAGING: Scan Result: 5

## 2020-02-22 NOTE — Progress Notes (Signed)
02/22/2020 3:52 PM   Anna Francis 08/24/1950 [id number]  Referring provider: Celene Squibb, MD [address],  [place] [address]  Recurrent UTI and gross hematuria  HPI: Anna Francis is a 69yo here for evaluation of recurrent UTI and gross hematuria. Over the past 2 years she has averaged 4-5 UTIs per year. With her last UTI episode she had gross hematuria. UA today shows 11-30 RBCs. She has had 3 stone events in the past. No hx of tobacco abuse and no exposure risks.  She has a hx of surgery for endometrosis 1998    PMH: Past Medical History:  Diagnosis Date  . Acid reflux   . Angio-edema   . Asthma   . Back pain   . Constipation   . Food allergy   . GERD (gastroesophageal reflux disease)   . Hiatal hernia   . Hypertension   . IBS (irritable bowel syndrome)   . Joint pain   . Kidney stone   . Kidney stones   . Lactose intolerance   . Pancreatitis   . Pinched nerve    right hip  . PONV (postoperative nausea and vomiting)   . Ruptured thoracic disc   . Sleep apnea    has not used CPAP in a year  . Spinal stenosis   . Swallowing difficulty   . Tachycardia   . Urinary problem in female   . Urticaria   . Vitamin D deficiency     Surgical History: Past Surgical History:  Procedure Laterality Date  . ABDOMINAL HYSTERECTOMY  1998  . BLADDER SURGERY  1998  . CYST REMOVAL HAND  1980's  . FOOT SURGERY Left 1980's  . [place]  . NASAL SINUS SURGERY    . ROTATOR CUFF REPAIR     left shoulder  . SHOULDER ARTHROSCOPY  03/02/2012   Procedure: ARTHROSCOPY SHOULDER;  Surgeon: Meredith Pel, MD;  Location: [facility];  Service: Orthopedics;  Laterality: Right;  Right shoulder diagnostic operative arthroscopy, rotator interval release manipulation    Home Medications:  Allergies as of 02/22/2020      Reactions   Anesthetics, Amide Nausea Only   Encainide Nausea Only   Keflex [cephalexin]    Latex Other (See Comments)    blisters   Lorabid [loracarbef] Itching, Other (See Comments)   bleeding   Peanut-containing Drug Products Other (See Comments)   GI upset   Penicillins Itching   Pork-derived Products Other (See Comments)   Sore throat   Raspberry Other (See Comments)   redness   Shellfish-derived Products Other (See Comments)   Sore throat   Other Rash   Strawberry Extract Rash      Medication List       Accurate as of February 22, 2020  3:52 PM. If you have any questions, ask your nurse or doctor.        Magnesium 400 MG Caps Take 1 tablet by mouth daily.   TYLENOL PO Take by mouth as needed.   Vitamin D (Ergocalciferol) 1.25 MG (50000 UNIT) Caps capsule Commonly known as: DRISDOL Take 1 capsule (50,000 Units total) by mouth every 7 (seven) days.       Allergies:  Allergies  Allergen Reactions  . Anesthetics, Amide Nausea Only  . Encainide Nausea Only  . Keflex [Cephalexin]   . Latex Other (See Comments)    blisters  . Lorabid [Loracarbef] Itching and Other (See Comments)    bleeding  .  Peanut-Containing Drug Products Other (See Comments)    GI upset  . Penicillins Itching  . Pork-Derived Products Other (See Comments)    Sore throat  . Raspberry Other (See Comments)    redness  . Shellfish-Derived Products Other (See Comments)    Sore throat  . Other Rash  . Strawberry Extract Rash    Family History: Family History  Problem Relation Age of Onset  . Diabetes Mother   . Hypertension Mother   . Heart disease Mother   . Thyroid disease Mother   . Cancer Mother   . Liver disease Mother   . Sleep apnea Mother   . Alcoholism Mother   . Obesity Mother   . Allergy (severe) Mother        latex, antibiotics  . Diabetes Father   . Hypertension Father   . Hyperlipidemia Father   . Stroke Father   . Allergy (severe) Father        foods and medications    Social History:  reports that she has never smoked. She has never used smokeless tobacco. She reports that she  does not drink alcohol and does not use drugs.  ROS: All other review of systems were reviewed and are negative except what is noted above in HPI  Physical Exam: BP 119/75   Pulse 71   Temp 98.2 F (36.8 C)   Ht 5' 2.8" (1.595 m)   Wt 207 lb (93.9 kg)   BMI 36.90 kg/m   Constitutional:  Alert and oriented, No acute distress. HEENT: [place] AT, moist mucus membranes.  Trachea midline, no masses. Cardiovascular: No clubbing, cyanosis, or edema. Respiratory: Normal respiratory effort, no increased work of breathing. GI: Abdomen is soft, nontender, nondistended, no abdominal masses GU: No CVA tenderness.  Lymph: No cervical or inguinal lymphadenopathy. Skin: No rashes, bruises or suspicious lesions. Neurologic: Grossly intact, no focal deficits, moving all 4 extremities. Psychiatric: Normal mood and affect.  Laboratory Data: Lab Results  Component Value Date   WBC 9.8 01/13/2020   HGB 13.6 01/13/2020   HCT 40.4 01/13/2020   MCV 90 01/13/2020   PLT 324 10/06/2019    Lab Results  Component Value Date   CREATININE 0.98 01/13/2020    No results found for: PSA  No results found for: TESTOSTERONE  Lab Results  Component Value Date   HGBA1C 5.6 10/06/2019    Urinalysis    Component Value Date/Time   COLORURINE YELLOW 05/19/2011 [place] 05/19/2011 1614   LABSPEC 1.018 05/19/2011 1614   PHURINE 5.5 05/19/2011 1614   GLUCOSEU NEGATIVE 05/19/2011 1614   HGBUR MODERATE (A) 05/19/2011 1614   BILIRUBINUR NEGATIVE 05/19/2011 1614   KETONESUR 15 (A) 05/19/2011 1614   PROTEINUR NEGATIVE 05/19/2011 1614   UROBILINOGEN 0.2 05/19/2011 1614   NITRITE NEGATIVE 05/19/2011 1614   LEUKOCYTESUR NEGATIVE 05/19/2011 1614    Lab Results  Component Value Date   BACTERIA RARE 05/19/2011    Pertinent Imaging:  Results for orders placed during the hospital encounter of 05/19/11  DG Abd 1 View  Narrative *RADIOLOGY REPORT*  Clinical Data: Left flank  pain.  ABDOMEN - 1 VIEW  Comparison: [place] [facility] CT abdomen and pelvis exam from 09/02/2006.  Findings: There is no evidence for gaseous bowel dilation to suggest obstruction.  A small phlebolith in the right hemi pelvis was present on the previous CT scan.  Visualized bony structures are unremarkable.  IMPRESSION: Normal bowel gas pattern.  Original  Report Authenticated By: ERIC A. MANSELL, M.D.  No results found for this or any previous visit.  No results found for this or any previous visit.  No results found for this or any previous visit.  No results found for this or any previous visit.  No results found for this or any previous visit.  No results found for this or any previous visit.  No results found for this or any previous visit.   Assessment & Plan:    1. Recurrent cystitis with hematuria Urine for culture -BMP -CT hematuria -Possible cystoscopy - BLADDER SCAN AMB NON-IMAGING - Urinalysis, Routine w reflex microscopic   No follow-ups on file.  Nicolette Bang, MD  [facility] [place]

## 2020-02-22 NOTE — Patient Instructions (Signed)
Hematuria, Adult Hematuria is blood in the urine. Blood may be visible in the urine, or it may be identified with a test. This condition can be caused by infections of the bladder, urethra, kidney, or prostate. Other possible causes include:  Kidney stones.  Cancer of the urinary tract.  Too much calcium in the urine.  Conditions that are passed from parent to child (inherited conditions).  Exercise that requires a lot of energy. Infections can usually be treated with medicine, and a kidney stone usually will pass through your urine. If neither of these is the cause of your hematuria, more tests may be needed to identify the cause of your symptoms. It is very important to tell your health care provider about any blood in your urine, even if it is painless or the blood stops without treatment. Blood in the urine, when it happens and then stops and then happens again, can be a symptom of a very serious condition, including cancer. There is no pain in the initial stages of many urinary cancers. Follow these instructions at home: Medicines  Take over-the-counter and prescription medicines only as told by your health care provider.  If you were prescribed an antibiotic medicine, take it as told by your health care provider. Do not stop taking the antibiotic even if you start to feel better. Eating and drinking  Drink enough fluid to keep your urine clear or pale yellow. It is recommended that you drink 3-4 quarts (2.8-3.8 L) a day. If you have been diagnosed with an infection, it is recommended that you drink cranberry juice in addition to large amounts of water.  Avoid caffeine, tea, and carbonated beverages. These tend to irritate the bladder.  Avoid alcohol because it may irritate the prostate (men). General instructions  If you have been diagnosed with a kidney stone, follow your health care provider's instructions about straining your urine to catch the stone.  Empty your bladder  often. Avoid holding urine for long periods of time.  If you are female: ? After a bowel movement, wipe from front to back and use each piece of toilet paper only once. ? Empty your bladder before and after sex.  Pay attention to any changes in your symptoms. Tell your health care provider about any changes or any new symptoms.  It is your responsibility to get your test results. Ask your health care provider, or the department performing the test, when your results will be ready.  Keep all follow-up visits as told by your health care provider. This is important. Contact a health care provider if:  You develop back pain.  You have a fever.  You have nausea or vomiting.  Your symptoms do not improve after 3 days.  Your symptoms get worse. Get help right away if:  You develop severe vomiting and are unable take medicine without vomiting.  You develop severe pain in your back or abdomen even though you are taking medicine.  You pass a large amount of blood in your urine.  You pass blood clots in your urine.  You feel very weak or like you might faint.  You faint. Summary  Hematuria is blood in the urine. It has many possible causes.  It is very important that you tell your health care provider about any blood in your urine, even if it is painless or the blood stops without treatment.  Take over-the-counter and prescription medicines only as told by your health care provider.  Drink enough fluid to keep   your urine clear or pale yellow. This information is not intended to replace advice given to you by your health care provider. Make sure you discuss any questions you have with your health care provider. Document Revised: 07/28/2018 Document Reviewed: 04/05/2016 Elsevier Patient Education  2020 Elsevier Inc.  

## 2020-02-22 NOTE — Progress Notes (Signed)
Bladder Scan Patient can void: 5 ml Performed By: Durenda Guthrie, lpn    Urological Symptom Review  Patient is experiencing the following symptoms: Hard to postpone urination Burning/pain with urination Get up at night to urinate Leakage of urine Blood in urine Weak stream  Kidney stones   Review of Systems  Gastrointestinal (upper)  : Indigestion/heartburn  Gastrointestinal (lower) : Diarrhea Constipation  Constitutional : Negative for symptoms  Skin: Negative for skin symptoms  Eyes: Negative for eye symptoms  Ear/Nose/Throat : Negative for Ear/Nose/Throat symptoms  Hematologic/Lymphatic: Negative for Hematologic/Lymphatic symptoms  Cardiovascular : Negative for cardiovascular symptoms  Respiratory : Negative for respiratory symptoms  Endocrine: Negative for endocrine symptoms  Musculoskeletal: Back pain Joint pain  Neurological: Negative for neurological symptoms  Psychologic: Negative for psychiatric symptoms

## 2020-02-25 LAB — URINE CULTURE

## 2020-02-27 ENCOUNTER — Encounter: Payer: Self-pay | Admitting: Urology

## 2020-04-03 ENCOUNTER — Encounter: Payer: Self-pay | Admitting: Allergy & Immunology

## 2020-04-03 ENCOUNTER — Ambulatory Visit (HOSPITAL_COMMUNITY): Admission: RE | Admit: 2020-04-03 | Payer: PPO | Source: Ambulatory Visit

## 2020-04-04 ENCOUNTER — Other Ambulatory Visit: Payer: Self-pay

## 2020-04-04 ENCOUNTER — Other Ambulatory Visit: Payer: PPO | Admitting: Urology

## 2020-04-04 ENCOUNTER — Other Ambulatory Visit: Payer: PPO

## 2020-04-04 DIAGNOSIS — N390 Urinary tract infection, site not specified: Secondary | ICD-10-CM

## 2020-04-04 LAB — URINALYSIS, ROUTINE W REFLEX MICROSCOPIC
Bilirubin, UA: NEGATIVE
Glucose, UA: NEGATIVE
Ketones, UA: NEGATIVE
Leukocytes,UA: NEGATIVE
Nitrite, UA: NEGATIVE
Protein,UA: NEGATIVE
Specific Gravity, UA: >1.03 — ABNORMAL HIGH (ref 1.005–1.030)
Urobilinogen, Ur: 0.2 mg/dL (ref 0.2–1.0)
pH, UA: 5.5 (ref 5.0–7.5)

## 2020-04-04 LAB — MICROSCOPIC EXAMINATION: Renal Epithel, UA: NONE SEEN /hpf

## 2020-04-04 NOTE — Addendum Note (Signed)
Addended by: Valentina Lucks on: 04/04/2020 04:12 PM   Modules accepted: Orders

## 2020-04-06 ENCOUNTER — Ambulatory Visit (HOSPITAL_COMMUNITY)
Admission: RE | Admit: 2020-04-06 | Discharge: 2020-04-06 | Disposition: A | Discharge status: Home / Self Care | Payer: PPO | Source: Ambulatory Visit | Attending: Urology | Admitting: Urology

## 2020-04-06 ENCOUNTER — Other Ambulatory Visit: Payer: Self-pay

## 2020-04-06 DIAGNOSIS — K760 Fatty (change of) liver, not elsewhere classified: Secondary | ICD-10-CM | POA: Insufficient documentation

## 2020-04-06 DIAGNOSIS — R310 Gross hematuria: Secondary | ICD-10-CM | POA: Diagnosis not present

## 2020-04-06 DIAGNOSIS — I700 Atherosclerosis of aorta: Secondary | ICD-10-CM | POA: Insufficient documentation

## 2020-04-06 DIAGNOSIS — N390 Urinary tract infection, site not specified: Secondary | ICD-10-CM | POA: Diagnosis not present

## 2020-04-06 DIAGNOSIS — Z87440 Personal history of urinary (tract) infections: Secondary | ICD-10-CM | POA: Diagnosis not present

## 2020-04-06 DIAGNOSIS — K449 Diaphragmatic hernia without obstruction or gangrene: Secondary | ICD-10-CM | POA: Insufficient documentation

## 2020-04-06 LAB — POCT I-STAT CREATININE: Creatinine, Ser: 1 mg/dL (ref 0.44–1.00)

## 2020-04-06 MED ORDER — IOHEXOL 300 MG/ML  SOLN
100.0000 mL | Freq: Once | INTRAMUSCULAR | Status: AC | PRN
  Administered 2020-04-06: 100 mL via INTRAVENOUS

## 2020-04-09 ENCOUNTER — Encounter: Payer: Self-pay | Admitting: Urology

## 2020-04-09 ENCOUNTER — Other Ambulatory Visit: Payer: Self-pay

## 2020-04-09 ENCOUNTER — Ambulatory Visit (INDEPENDENT_AMBULATORY_CARE_PROVIDER_SITE_OTHER): Payer: PPO | Admitting: Urology

## 2020-04-09 VITALS — BP 118/74 | HR 69 | Temp 98.7°F | Ht 62.0 in | Wt 207.0 lb

## 2020-04-09 DIAGNOSIS — R310 Gross hematuria: Secondary | ICD-10-CM

## 2020-04-09 LAB — URINALYSIS, ROUTINE W REFLEX MICROSCOPIC
Bilirubin, UA: NEGATIVE
Glucose, UA: NEGATIVE
Leukocytes,UA: NEGATIVE
Nitrite, UA: NEGATIVE
RBC, UA: NEGATIVE
Specific Gravity, UA: >1.03 — ABNORMAL HIGH (ref 1.005–1.030)
Urobilinogen, Ur: 0.2 mg/dL (ref 0.2–1.0)
pH, UA: 5.5 (ref 5.0–7.5)

## 2020-04-09 LAB — MICROSCOPIC EXAMINATION: Renal Epithel, UA: NONE SEEN /hpf

## 2020-04-09 MED ORDER — NITROFURANTOIN MACROCRYSTAL 50 MG PO CAPS: 50.0000 mg | ORAL_CAPSULE | Freq: Every day | ORAL | 11 refills | Status: DC

## 2020-04-09 MED ORDER — CIPROFLOXACIN HCL 500 MG PO TABS
500.0000 mg | ORAL_TABLET | Freq: Once | ORAL | Status: AC
  Administered 2020-04-09: 500 mg via ORAL

## 2020-04-09 NOTE — Progress Notes (Signed)
Urological Symptom Review  Patient is experiencing the following symptoms: Hard to postpone urination Burning/pain with urination Get up at night to urinate Blood in urine Urinary tract infection Weak stream  Kidney stones   Review of Systems  Gastrointestinal (upper)  : Indigestion/heartburn  Gastrointestinal (lower) : Negative for lower GI symptoms  Constitutional : Negative for symptoms  Skin: Negative for skin symptoms  Eyes: Negative for eye symptoms  Ear/Nose/Throat : Negative for Ear/Nose/Throat symptoms  Hematologic/Lymphatic: Negative for Hematologic/Lymphatic symptoms  Cardiovascular : Negative for cardiovascular symptoms  Respiratory : Negative for respiratory symptoms  Endocrine: Negative for endocrine symptoms  Musculoskeletal: Back pain Joint pain  Neurological: Negative for neurological symptoms  Psychologic: Negative for psychiatric symptoms

## 2020-04-09 NOTE — Progress Notes (Signed)
E scribing down- prescription printed and faxed to [facility].

## 2020-04-09 NOTE — Progress Notes (Signed)
   04/09/20  CC: recurrent UTI  HPI: [name] is a 70yo here for followup for recurrent UTI. CT hematuria showed no GU abnormalities.   Blood pressure 118/74, pulse 69, temperature 98.7 F (37.1 C), height 5\' 2"  (1.575 m), weight 207 lb (93.9 kg). NED. A&Ox3.   No respiratory distress   Abd soft, NT, ND Normal external genitalia with patent urethral meatus  Cystoscopy Procedure Note  Patient identification was confirmed, informed consent was obtained, and patient was prepped using Betadine solution.  Lidocaine jelly was administered per urethral meatus.    Procedure: - Flexible cystoscope introduced, without any difficulty.   - Thorough search of the bladder revealed:    normal urethral meatus    normal urothelium    no stones    no ulcers     no tumors    no urethral polyps    no trabeculation  - Ureteral orifices were normal in position and appearance.  Post-Procedure: - Patient tolerated the procedure well  Assessment/ Plan: -We discussed the natural hx of recurrent UTIs and the various causes. We discussed the treatment options including post coital prophylaxis, daily prophylaxis, topical estrogen therapy. We will start macrobid 50mg  QHS. RTC 3 months    Return in about 3 months (around 07/08/2020).  Nicolette Bang, MD

## 2020-04-09 NOTE — Patient Instructions (Signed)
Urinary Tract Infection, Adult A urinary tract infection (UTI) is an infection of any part of the urinary tract. The urinary tract includes:  The kidneys.  The ureters.  The bladder.  The urethra. These organs make, store, and get rid of pee (urine) in the body. What are the causes? This infection is caused by germs (bacteria) in your genital area. These germs grow and cause swelling (inflammation) of your urinary tract. What increases the risk? The following factors may make you more likely to develop this condition:  Using a small, thin tube (catheter) to drain pee.  Not being able to control when you pee or poop (incontinence).  Being female. If you are female, these things can increase the risk: ? Using these methods to prevent pregnancy:  A medicine that kills sperm (spermicide).  A device that blocks sperm (diaphragm). ? Having low levels of a female hormone (estrogen). ? Being pregnant. You are more likely to develop this condition if:  You have genes that add to your risk.  You are sexually active.  You take antibiotic medicines.  You have trouble peeing because of: ? A prostate that is bigger than normal, if you are female. ? A blockage in the part of your body that drains pee from the bladder. ? A kidney stone. ? A nerve condition that affects your bladder. ? Not getting enough to drink. ? Not peeing often enough.  You have other conditions, such as: ? Diabetes. ? A weak disease-fighting system (immune system). ? Sickle cell disease. ? Gout. ? Injury of the spine. What are the signs or symptoms? Symptoms of this condition include:  Needing to pee right away.  Peeing small amounts often.  Pain or burning when peeing.  Blood in the pee.  Pee that smells bad or not like normal.  Trouble peeing.  Pee that is cloudy.  Fluid coming from the vagina, if you are female.  Pain in the belly or lower back. Other symptoms include:  Vomiting.  Not  feeling hungry.  Feeling mixed up (confused). This may be the first symptom in older adults.  Being tired and grouchy (irritable).  A fever.  Watery poop (diarrhea). How is this treated?  Taking antibiotic medicine.  Taking other medicines.  Drinking enough water. In some cases, you may need to see a specialist. Follow these instructions at home: Medicines  Take over-the-counter and prescription medicines only as told by your doctor.  If you were prescribed an antibiotic medicine, take it as told by your doctor. Do not stop taking it even if you start to feel better. General instructions  Make sure you: ? Pee until your bladder is empty. ? Do not hold pee for a long time. ? Empty your bladder after sex. ? Wipe from front to back after peeing or pooping if you are a female. Use each tissue one time when you wipe.  Drink enough fluid to keep your pee pale yellow.  Keep all follow-up visits.   Contact a doctor if:  You do not get better after 1-2 days.  Your symptoms go away and then come back. Get help right away if:  You have very bad back pain.  You have very bad pain in your lower belly.  You have a fever.  You have chills.  You feeling like you will vomit or you vomit. Summary  A urinary tract infection (UTI) is an infection of any part of the urinary tract.  This condition is caused by   germs in your genital area.  There are many risk factors for a UTI.  Treatment includes antibiotic medicines.  Drink enough fluid to keep your pee pale yellow. This information is not intended to replace advice given to you by your health care provider. Make sure you discuss any questions you have with your health care provider. Document Revised: 10/14/2019 Document Reviewed: 10/14/2019 Elsevier Patient Education  2021 Elsevier Inc.  

## 2020-04-11 ENCOUNTER — Ambulatory Visit: Payer: PPO | Admitting: Allergy & Immunology

## 2020-04-11 LAB — CULTURE, URINE COMPREHENSIVE

## 2020-04-19 NOTE — Progress Notes (Signed)
Sent via mychart

## 2020-07-09 ENCOUNTER — Encounter: Payer: Self-pay | Admitting: Urology

## 2020-07-09 ENCOUNTER — Ambulatory Visit (INDEPENDENT_AMBULATORY_CARE_PROVIDER_SITE_OTHER): Payer: PPO | Admitting: Urology

## 2020-07-09 ENCOUNTER — Other Ambulatory Visit: Payer: Self-pay

## 2020-07-09 VITALS — BP 134/84 | HR 69 | Temp 98.1°F | Wt 203.0 lb

## 2020-07-09 DIAGNOSIS — R310 Gross hematuria: Secondary | ICD-10-CM

## 2020-07-09 DIAGNOSIS — N390 Urinary tract infection, site not specified: Secondary | ICD-10-CM | POA: Diagnosis not present

## 2020-07-09 LAB — URINALYSIS, ROUTINE W REFLEX MICROSCOPIC
Bilirubin, UA: NEGATIVE
Glucose, UA: NEGATIVE
Ketones, UA: NEGATIVE
Leukocytes,UA: NEGATIVE
Nitrite, UA: NEGATIVE
Protein,UA: NEGATIVE
Specific Gravity, UA: 1.025 (ref 1.005–1.030)
Urobilinogen, Ur: 0.2 mg/dL (ref 0.2–1.0)
pH, UA: 5.5 (ref 5.0–7.5)

## 2020-07-09 LAB — MICROSCOPIC EXAMINATION
Bacteria, UA: NONE SEEN
Epithelial Cells (non renal): NONE SEEN /hpf (ref 0–10)
Renal Epithel, UA: NONE SEEN /hpf
WBC, UA: NONE SEEN /hpf (ref 0–5)

## 2020-07-09 MED ORDER — NITROFURANTOIN MACROCRYSTAL 50 MG PO CAPS: 50.0000 mg | ORAL_CAPSULE | Freq: Every day | ORAL | 3 refills | Status: DC

## 2020-07-09 NOTE — Progress Notes (Signed)
Urological Symptom Review  Patient is experiencing the following symptoms: Hard to postpone urination Get up at night to urinate   Review of Systems  Gastrointestinal (upper)  : Negative for upper GI symptoms  Gastrointestinal (lower) : Constipation  Constitutional : Negative for symptoms  Skin: Negative for skin symptoms  Eyes: Negative for eye symptoms  Ear/Nose/Throat : Negative for Ear/Nose/Throat symptoms  Hematologic/Lymphatic: Negative for Hematologic/Lymphatic symptoms  Cardiovascular : Negative for cardiovascular symptoms  Respiratory : Negative for respiratory symptoms  Endocrine: Negative for endocrine symptoms  Musculoskeletal: Negative for musculoskeletal symptoms  Neurological: Negative for neurological symptoms  Psychologic: Negative for psychiatric symptoms

## 2020-07-09 NOTE — Progress Notes (Signed)
07/09/2020 8:52 AM   Cathlyn Parsons 10-Feb-1951 [id number]  Referring provider: Medicine, [facility] No address on file  Recurrent UTI  HPI: [name] is a 70yo here for followup for recurrent UTI and gross hematuria. No gross hematuria since last visit. UA shows 3-10 RBCs/hpf. No worsening LUTS. No UTIs since last visit. Overall she is happy with her response to therapy.    PMH: Past Medical History:  Diagnosis Date  . Acid reflux   . Angio-edema   . Asthma   . Back pain   . Constipation   . Food allergy   . GERD (gastroesophageal reflux disease)   . Hiatal hernia   . Hypertension   . IBS (irritable bowel syndrome)   . Joint pain   . Kidney stone   . Kidney stones   . Lactose intolerance   . Pancreatitis   . Pinched nerve    right hip  . PONV (postoperative nausea and vomiting)   . Ruptured thoracic disc   . Sleep apnea    has not used CPAP in a year  . Spinal stenosis   . Swallowing difficulty   . Tachycardia   . Urinary problem in female   . Urticaria   . Vitamin D deficiency     Surgical History: Past Surgical History:  Procedure Laterality Date  . ABDOMINAL HYSTERECTOMY  1998  . BLADDER SURGERY  1998  . CYST REMOVAL HAND  1980's  . FOOT SURGERY Left 1980's  . [place]  . NASAL SINUS SURGERY    . ROTATOR CUFF REPAIR     left shoulder  . SHOULDER ARTHROSCOPY  03/02/2012   Procedure: ARTHROSCOPY SHOULDER;  Surgeon: Meredith Pel, MD;  Location: [place];  Service: Orthopedics;  Laterality: Right;  Right shoulder diagnostic operative arthroscopy, rotator interval release manipulation    Home Medications:  Allergies as of 07/09/2020      Reactions   Anesthetics, Amide Nausea Only   Encainide Nausea Only   Keflex [cephalexin]    Latex Other (See Comments)   blisters   Lorabid [loracarbef] Itching, Other (See Comments)   bleeding   Peanut-containing Drug Products Other (See Comments)   GI upset    Penicillins Itching   Pork-derived Products Other (See Comments)   Sore throat   Raspberry Other (See Comments)   redness   Shellfish-derived Products Other (See Comments)   Sore throat   Other Rash   Strawberry Extract Rash      Medication List       Accurate as of July 09, 2020  8:52 AM. If you have any questions, ask your nurse or doctor.        Magnesium 400 MG Caps Take 1 tablet by mouth daily.   nitrofurantoin 50 MG capsule Commonly known as: MACRODANTIN Take 1 capsule (50 mg total) by mouth at bedtime.   TYLENOL PO Take by mouth as needed.   Vitamin D (Ergocalciferol) 1.25 MG (50000 UNIT) Caps capsule Commonly known as: DRISDOL Take 1 capsule (50,000 Units total) by mouth every 7 (seven) days.       Allergies:  Allergies  Allergen Reactions  . Anesthetics, Amide Nausea Only  . Encainide Nausea Only  . Keflex [Cephalexin]   . Latex Other (See Comments)    blisters  . Lorabid [Loracarbef] Itching and Other (See Comments)    bleeding  . Peanut-Containing Drug Products Other (See Comments)    GI upset  .  Penicillins Itching  . Pork-Derived Products Other (See Comments)    Sore throat  . Raspberry Other (See Comments)    redness  . Shellfish-Derived Products Other (See Comments)    Sore throat  . Other Rash  . Strawberry Extract Rash    Family History: Family History  Problem Relation Age of Onset  . Diabetes Mother   . Hypertension Mother   . Heart disease Mother   . Thyroid disease Mother   . Cancer Mother   . Liver disease Mother   . Sleep apnea Mother   . Alcoholism Mother   . Obesity Mother   . Allergy (severe) Mother        latex, antibiotics  . Diabetes Father   . Hypertension Father   . Hyperlipidemia Father   . Stroke Father   . Allergy (severe) Father        foods and medications    Social History:  reports that she has never smoked. She has never used smokeless tobacco. She reports that she does not drink alcohol and  does not use drugs.  ROS: All other review of systems were reviewed and are negative except what is noted above in HPI  Physical Exam: BP 134/84   Pulse 69   Temp 98.1 F (36.7 C)   Wt 203 lb (92.1 kg)   BMI 37.13 kg/m   Constitutional:  Alert and oriented, No acute distress. HEENT: [place] AT, moist mucus membranes.  Trachea midline, no masses. Cardiovascular: No clubbing, cyanosis, or edema. Respiratory: Normal respiratory effort, no increased work of breathing. GI: Abdomen is soft, nontender, nondistended, no abdominal masses GU: No CVA tenderness.  Lymph: No cervical or inguinal lymphadenopathy. Skin: No rashes, bruises or suspicious lesions. Neurologic: Grossly intact, no focal deficits, moving all 4 extremities. Psychiatric: Normal mood and affect.  Laboratory Data: Lab Results  Component Value Date   WBC 9.8 01/13/2020   HGB 13.6 01/13/2020   HCT 40.4 01/13/2020   MCV 90 01/13/2020   PLT 324 10/06/2019    Lab Results  Component Value Date   CREATININE 1.00 04/06/2020    No results found for: PSA  No results found for: TESTOSTERONE  Lab Results  Component Value Date   HGBA1C 5.6 10/06/2019    Urinalysis    Component Value Date/Time   COLORURINE YELLOW 05/19/2011 1614   APPEARANCEUR Clear 04/09/2020 0916   LABSPEC 1.018 05/19/2011 1614   PHURINE 5.5 05/19/2011 1614   GLUCOSEU Negative 04/09/2020 0916   HGBUR MODERATE (A) 05/19/2011 1614   BILIRUBINUR Negative 04/09/2020 0916   KETONESUR 15 (A) 05/19/2011 1614   PROTEINUR Trace (A) 04/09/2020 0916   PROTEINUR NEGATIVE 05/19/2011 1614   UROBILINOGEN 0.2 05/19/2011 1614   NITRITE Negative 04/09/2020 0916   NITRITE NEGATIVE 05/19/2011 1614   LEUKOCYTESUR Negative 04/09/2020 0916    Lab Results  Component Value Date   LABMICR See below: 04/09/2020   WBCUA 0-5 04/09/2020   LABEPIT 0-10 04/09/2020   BACTERIA Few (A) 04/09/2020    Pertinent Imaging:  Results for orders placed during the hospital  encounter of 05/19/11  DG Abd 1 View  Narrative *RADIOLOGY REPORT*  Clinical Data: Left flank pain.  ABDOMEN - 1 VIEW  Comparison: [place] at [facility] CT abdomen and pelvis exam from 09/02/2006.  Findings: There is no evidence for gaseous bowel dilation to suggest obstruction.  A small phlebolith in the right hemi pelvis was present on the previous CT scan.  Visualized bony structures  are unremarkable.  IMPRESSION: Normal bowel gas pattern.  Original Report Authenticated By: ERIC A. MANSELL, M.D.  No results found for this or any previous visit.  No results found for this or any previous visit.  No results found for this or any previous visit.  No results found for this or any previous visit.  No results found for this or any previous visit.  Results for orders placed during the hospital encounter of 04/06/20  CT HEMATURIA WORKUP  Narrative CLINICAL DATA:  Recurrent urinary tract infections and gross hematuria  EXAM: CT ABDOMEN AND PELVIS WITHOUT AND WITH CONTRAST  TECHNIQUE: Multidetector CT imaging of the abdomen and pelvis was performed following the standard protocol before and following the bolus administration of intravenous contrast.  CONTRAST:  130mL OMNIPAQUE IOHEXOL 300 MG/ML SOLN 100 cc of Omnipaque 300  COMPARISON:  05/19/2011 stone study  FINDINGS: Lower chest: Clear lung bases. Mild cardiomegaly, without pericardial or pleural effusion. Tiny hiatal hernia.  Hepatobiliary: Moderate hepatic steatosis. Normal gallbladder, without biliary ductal dilatation.  Pancreas: Normal, without mass or ductal dilatation.  Spleen: Normal in size, without focal abnormality.  Adrenals/Urinary Tract: Normal adrenal glands. No renal calculi or hydronephrosis. No hydroureter or ureteric calculi. No bladder calculi.  Upper pole left renal 1.0 cm lesion is borderline too small to characterize, favored to represent a cyst or  minimally complex cyst.  No suspicious renal mass on post-contrast imaging. Moderate renal collecting system opacification on delayed images. Suboptimal distal left ureteric opacification. No filling defects in the opacified collecting systems or ureters. No enhancing bladder mass or filling defect on delayed images.  Stomach/Bowel: Normal remainder of the stomach. Scattered colonic diverticula. Normal terminal ileum and appendix. Normal small bowel.  Vascular/Lymphatic: Aortic atherosclerosis. No abdominopelvic adenopathy.  Reproductive: Hysterectomy.  No adnexal mass.  Other: No significant free fluid.  Mild pelvic floor laxity.  Musculoskeletal: No acute osseous abnormality.  IMPRESSION: 1. No acute process or explanation for hematuria. 2. Hepatic steatosis. 3. Tiny hiatal hernia. 4. Aortic Atherosclerosis (ICD10-I70.0).   Electronically Signed By: Abigail Miyamoto M.D. On: 04/06/2020 15:50  No results found for this or any previous visit.   Assessment & Plan:    1. Gross hematuria -resolved  2. Recurrent UTI -continue macrodantin 50mg  qhs - Urinalysis, Routine w reflex microscopic   No follow-ups on file.  Nicolette Bang, MD  [facility] [place]

## 2020-07-09 NOTE — Patient Instructions (Signed)
Urinary Tract Infection, Adult  A urinary tract infection (UTI) is an infection of any part of the urinary tract. The urinary tract includes the kidneys, ureters, bladder, and urethra. These organs make, store, and get rid of urine in the body. An upper UTI affects the ureters and kidneys. A lower UTI affects the bladder and urethra. What are the causes? Most urinary tract infections are caused by bacteria in your genital area around your urethra, where urine leaves your body. These bacteria grow and cause inflammation of your urinary tract. What increases the risk? You are more likely to develop this condition if:  You have a urinary catheter that stays in place.  You are not able to control when you urinate or have a bowel movement (incontinence).  You are female and you: ? Use a spermicide or diaphragm for birth control. ? Have low estrogen levels. ? Are pregnant.  You have certain genes that increase your risk.  You are sexually active.  You take antibiotic medicines.  You have a condition that causes your flow of urine to slow down, such as: ? An enlarged prostate, if you are female. ? Blockage in your urethra. ? A kidney stone. ? A nerve condition that affects your bladder control (neurogenic bladder). ? Not getting enough to drink, or not urinating often.  You have certain medical conditions, such as: ? Diabetes. ? A weak disease-fighting system (immunesystem). ? Sickle cell disease. ? Gout. ? Spinal cord injury. What are the signs or symptoms? Symptoms of this condition include:  Needing to urinate right away (urgency).  Frequent urination. This may include small amounts of urine each time you urinate.  Pain or burning with urination.  Blood in the urine.  Urine that smells bad or unusual.  Trouble urinating.  Cloudy urine.  Vaginal discharge, if you are female.  Pain in the abdomen or the lower back. You may also have:  Vomiting or a decreased  appetite.  Confusion.  Irritability or tiredness.  A fever or chills.  Diarrhea. The first symptom in older adults may be confusion. In some cases, they may not have any symptoms until the infection has worsened. How is this diagnosed? This condition is diagnosed based on your medical history and a physical exam. You may also have other tests, including:  Urine tests.  Blood tests.  Tests for STIs (sexually transmitted infections). If you have had more than one UTI, a cystoscopy or imaging studies may be done to determine the cause of the infections. How is this treated? Treatment for this condition includes:  Antibiotic medicine.  Over-the-counter medicines to treat discomfort.  Drinking enough water to stay hydrated. If you have frequent infections or have other conditions such as a kidney stone, you may need to see a health care provider who specializes in the urinary tract (urologist). In rare cases, urinary tract infections can cause sepsis. Sepsis is a life-threatening condition that occurs when the body responds to an infection. Sepsis is treated in the hospital with IV antibiotics, fluids, and other medicines. Follow these instructions at home: Medicines  Take over-the-counter and prescription medicines only as told by your health care provider.  If you were prescribed an antibiotic medicine, take it as told by your health care provider. Do not stop using the antibiotic even if you start to feel better. General instructions  Make sure you: ? Empty your bladder often and completely. Do not hold urine for long periods of time. ? Empty your bladder after   sex. ? Wipe from front to back after urinating or having a bowel movement if you are female. Use each tissue only one time when you wipe.  Drink enough fluid to keep your urine pale yellow.  Keep all follow-up visits. This is important.   Contact a health care provider if:  Your symptoms do not get better after 1-2  days.  Your symptoms go away and then return. Get help right away if:  You have severe pain in your back or your lower abdomen.  You have a fever or chills.  You have nausea or vomiting. Summary  A urinary tract infection (UTI) is an infection of any part of the urinary tract, which includes the kidneys, ureters, bladder, and urethra.  Most urinary tract infections are caused by bacteria in your genital area.  Treatment for this condition often includes antibiotic medicines.  If you were prescribed an antibiotic medicine, take it as told by your health care provider. Do not stop using the antibiotic even if you start to feel better.  Keep all follow-up visits. This is important. This information is not intended to replace advice given to you by your health care provider. Make sure you discuss any questions you have with your health care provider. Document Revised: 10/14/2019 Document Reviewed: 10/14/2019 Elsevier Patient Education  2021 Elsevier Inc.  

## 2020-09-21 ENCOUNTER — Other Ambulatory Visit: Payer: Self-pay | Admitting: Urology

## 2020-09-21 DIAGNOSIS — R310 Gross hematuria: Secondary | ICD-10-CM

## 2020-11-07 DIAGNOSIS — D0371 Melanoma in situ of right lower limb, including hip: Secondary | ICD-10-CM | POA: Diagnosis not present

## 2020-11-07 DIAGNOSIS — L814 Other melanin hyperpigmentation: Secondary | ICD-10-CM | POA: Diagnosis not present

## 2020-11-07 DIAGNOSIS — L738 Other specified follicular disorders: Secondary | ICD-10-CM | POA: Diagnosis not present

## 2020-11-07 DIAGNOSIS — S80861A Insect bite (nonvenomous), right lower leg, initial encounter: Secondary | ICD-10-CM | POA: Diagnosis not present

## 2020-11-07 DIAGNOSIS — Z85828 Personal history of other malignant neoplasm of skin: Secondary | ICD-10-CM | POA: Diagnosis not present

## 2020-11-26 ENCOUNTER — Telehealth: Payer: Self-pay

## 2020-11-26 NOTE — Telephone Encounter (Signed)
Patient left voicemail message to request [facility] as her provider; stated her husband is a patient at this office. Please advise at [phone].

## 2020-11-26 NOTE — Telephone Encounter (Signed)
Patient spouse is: Maesie, Reiche" - TY:[phone]  Please advise.

## 2020-11-26 NOTE — Telephone Encounter (Signed)
Please contact patient and schedule new patient appointment.

## 2020-11-27 DIAGNOSIS — Z85828 Personal history of other malignant neoplasm of skin: Secondary | ICD-10-CM | POA: Diagnosis not present

## 2020-11-27 DIAGNOSIS — C4371 Malignant melanoma of right lower limb, including hip: Secondary | ICD-10-CM | POA: Diagnosis not present

## 2020-11-27 DIAGNOSIS — L988 Other specified disorders of the skin and subcutaneous tissue: Secondary | ICD-10-CM | POA: Diagnosis not present

## 2020-12-07 NOTE — Telephone Encounter (Signed)
Left message on patient's voicemail to request call back for scheduling.

## 2021-01-07 ENCOUNTER — Ambulatory Visit: Payer: PPO | Admitting: Urology

## 2021-01-08 ENCOUNTER — Other Ambulatory Visit: Payer: Self-pay

## 2021-01-08 ENCOUNTER — Encounter: Payer: Self-pay | Admitting: Family Medicine

## 2021-01-08 ENCOUNTER — Ambulatory Visit (INDEPENDENT_AMBULATORY_CARE_PROVIDER_SITE_OTHER): Payer: PPO | Admitting: Family Medicine

## 2021-01-08 VITALS — BP 144/90 | HR 69 | Temp 97.6°F | Ht 62.0 in | Wt 204.0 lb

## 2021-01-08 DIAGNOSIS — Z7689 Persons encountering health services in other specified circumstances: Secondary | ICD-10-CM

## 2021-01-08 DIAGNOSIS — G4733 Obstructive sleep apnea (adult) (pediatric): Secondary | ICD-10-CM

## 2021-01-08 DIAGNOSIS — I10 Essential (primary) hypertension: Secondary | ICD-10-CM | POA: Diagnosis not present

## 2021-01-08 DIAGNOSIS — Z6836 Body mass index (BMI) 36.0-36.9, adult: Secondary | ICD-10-CM | POA: Diagnosis not present

## 2021-01-08 DIAGNOSIS — Z780 Asymptomatic menopausal state: Secondary | ICD-10-CM | POA: Diagnosis not present

## 2021-01-08 DIAGNOSIS — F439 Reaction to severe stress, unspecified: Secondary | ICD-10-CM

## 2021-01-08 MED ORDER — AZITHROMYCIN 250 MG PO TABS: ORAL_TABLET | ORAL | 0 refills | Status: DC

## 2021-01-08 MED ORDER — ALPRAZOLAM 0.5 MG PO TABS: 0.5000 mg | ORAL_TABLET | Freq: Three times a day (TID) | ORAL | 0 refills | Status: DC | PRN

## 2021-01-08 NOTE — Progress Notes (Signed)
Subjective:    Patient ID: Anna Francis, female    DOB: 1950/05/25, 70 y.o.   MRN: [id number]  HPI Patient is a very pleasant 70 year old Caucasian female here today to establish care with me.  Her husband is my patient.  Unfortunately he is doing very poorly in the hospital.  He was admitted with congestive heart failure, sepsis due to E. coli and MRSA, hemodialysis dependent renal failure.  Patient states that he was on the verge of dying this weekend.  She has been by his side nonstop throughout the entire ordeal.  She seems overwhelmed.  She states that she is not sleeping.  She is not eating.  She does not want to leave the house.  Sunday, she came home from the hospital and has slept for 2 days straight.  Her children are staying with her husband just to give her some rest.  She is tearful when discussing this.  She does not feel that she is at the point of depression.  She denies anhedonia.  She denies feeling depressed.  She denies suicidal thoughts.  She just feels worn out and overcome by stress.  Her blood pressure today is elevated at 144/90.  She has a history of hypertension but she has not been taking any medication.  Today she is extremely tearful therefore I question the accuracy of this blood pressure.  Past medical history is also significant for frequent urinary tract infections.  Her urologist has given her Macrobid to take at the first sign of a urinary tract infection.  She is supposed to be taking it every day as a preventative however she states that she takes it once a week perhaps.  This causes her constipation.  Her mammogram has been scheduled for November.  She is uncertain when she had her last colonoscopy.  She has a history of a total abdominal hysterectomy including oophorectomy and therefore does not require Pap smears.  She has not had a bone density test in 15 years.  She went through menopause in her early 45s due to a hysterectomy.  She is due for Pneumovax 23,  Prevnar 20, Shingrix, COVID booster, and a flu shot.  She declines vaccinations today due to the current situation.  She states that she feels sick as well.  She has a sinus infection per her report.  She reports head congestion, sinus pain, sinus pressure, postnasal drip.  This is been going on for over a week.  She typically takes a Z-Pak in response quickly.  She has a history of penicillin allergy Past Medical History:  Diagnosis Date   Acid reflux    Angio-edema    Asthma    Back pain    Constipation    Food allergy    GERD (gastroesophageal reflux disease)    Hiatal hernia    Hypertension    IBS (irritable bowel syndrome)    Joint pain    Kidney stone    Kidney stones    Lactose intolerance    Pancreatitis    Pinched nerve    right hip   PONV (postoperative nausea and vomiting)    Ruptured thoracic disc    Sleep apnea    has not used CPAP in a year   Spinal stenosis    Swallowing difficulty    Tachycardia    Urinary problem in female    Urticaria    Vitamin D deficiency    Past Surgical History:  Procedure Laterality Date  ABDOMINAL HYSTERECTOMY  1998   BLADDER SURGERY  1998   CYST REMOVAL HAND  1980's   FOOT SURGERY Left 1980's   MANDIBLE FRACTURE SURGERY  1979   NASAL SINUS SURGERY     ROTATOR CUFF REPAIR     left shoulder   SHOULDER ARTHROSCOPY  03/02/2012   Procedure: ARTHROSCOPY SHOULDER;  Surgeon: Meredith Pel, MD;  Location: [place];  Service: Orthopedics;  Laterality: Right;  Right shoulder diagnostic operative arthroscopy, rotator interval release manipulation   Current Outpatient Medications on File Prior to Visit  Medication Sig Dispense Refill   Acetaminophen (TYLENOL PO) Take by mouth as needed.     aspirin EC 81 MG tablet Take 81 mg by mouth daily. Swallow whole.     Magnesium 400 MG CAPS Take 1 tablet by mouth daily.     nitrofurantoin (MACRODANTIN) 50 MG capsule TAKE ONE CAPSULE BY MOUTH EVERY NIGHT AT BEDTIME 30 capsule 11   Turmeric (QC  TUMERIC COMPLEX PO) Take by mouth.     Vitamin D, Ergocalciferol, (DRISDOL) 1.25 MG (50000 UNIT) CAPS capsule Take 1 capsule (50,000 Units total) by mouth every 7 (seven) days. 4 capsule 0   No current facility-administered medications on file prior to visit.   Allergies  Allergen Reactions   Anesthetics, Amide Nausea Only   Encainide Nausea Only   Keflex [Cephalexin]    Latex Other (See Comments)    blisters   Lorabid [Loracarbef] Itching and Other (See Comments)    bleeding   Peanut-Containing Drug Products Other (See Comments)    GI upset   Penicillins Itching   Pork-Derived Products Other (See Comments)    Sore throat   Raspberry Other (See Comments)    redness   Shellfish-Derived Products Other (See Comments)    Sore throat   Other Rash   Strawberry Extract Rash   Social History   Socioeconomic History   Marital status: Married    Spouse name: Anna Francis   Number of children: Not on file   Years of education: Not on file   Highest education level: Not on file  Occupational History   Occupation: Clinical cytogeneticist  Tobacco Use   Smoking status: Never   Smokeless tobacco: Never  Vaping Use   Vaping Use: Never used  Substance and Sexual Activity   Alcohol use: No   Drug use: No   Sexual activity: Not on file  Other Topics Concern   Not on file  Social History Narrative   Not on file   Social Determinants of Health   Financial Resource Strain: Not on file  Food Insecurity: Not on file  Transportation Needs: Not on file  Physical Activity: Not on file  Stress: Not on file  Social Connections: Not on file  Intimate Partner Violence: Not on file   Family History  Problem Relation Age of Onset   Diabetes Mother    Hypertension Mother    Heart disease Mother    Thyroid disease Mother    Cancer Mother    Liver disease Mother    Sleep apnea Mother    Alcoholism Mother    Obesity Mother    Allergy (severe) Mother        latex, antibiotics   Diabetes Father     Hypertension Father    Hyperlipidemia Father    Stroke Father    Allergy (severe) Father        foods and medications      Review of Systems  Objective:   Physical Exam Vitals reviewed.  Constitutional:      General: She is not in acute distress.    Appearance: Normal appearance. She is not ill-appearing, toxic-appearing or diaphoretic.  HENT:     Head: Normocephalic and atraumatic.     Right Ear: Tympanic membrane normal.     Left Ear: Tympanic membrane normal.     Nose: Congestion and rhinorrhea present.     Right Sinus: Frontal sinus tenderness present.     Left Sinus: Frontal sinus tenderness present.     Mouth/Throat:     Mouth: Mucous membranes are moist.     Pharynx: Oropharynx is clear. No oropharyngeal exudate or posterior oropharyngeal erythema.  Eyes:     Extraocular Movements: Extraocular movements intact.     Conjunctiva/sclera: Conjunctivae normal.     Pupils: Pupils are equal, round, and reactive to light.  Neck:     Vascular: No carotid bruit.  Cardiovascular:     Rate and Rhythm: Normal rate and regular rhythm.     Pulses: Normal pulses.     Heart sounds: Normal heart sounds. No murmur heard.   No friction rub. No gallop.  Pulmonary:     Effort: Pulmonary effort is normal. No respiratory distress.     Breath sounds: Normal breath sounds. No stridor. No wheezing, rhonchi or rales.  Abdominal:     General: Bowel sounds are normal. There is no distension.     Palpations: Abdomen is soft.     Tenderness: There is no abdominal tenderness. There is no guarding or rebound.  Musculoskeletal:     Cervical back: Normal range of motion and neck supple. No rigidity.     Right lower leg: No edema.     Left lower leg: No edema.  Lymphadenopathy:     Cervical: No cervical adenopathy.  Skin:    General: Skin is warm.     Coloration: Skin is not jaundiced.     Findings: No bruising, erythema, lesion or rash.  Neurological:     General: No focal deficit  present.     Mental Status: She is alert and oriented to person, place, and time. Mental status is at baseline.     Cranial Nerves: No cranial nerve deficit.     Motor: No weakness.     Coordination: Coordination normal.  Psychiatric:        Mood and Affect: Mood normal.        Behavior: Behavior normal.        Thought Content: Thought content normal.        Judgment: Judgment normal.         Assessment & Plan:   Postmenopausal estrogen deficiency - Plan: DG Bone Density  Benign essential HTN - Plan: CBC with Differential/Platelet, COMPLETE METABOLIC PANEL WITH GFR, Lipid panel  Class 2 severe obesity with serious comorbidity and body mass index (BMI) of 36.0 to 36.9 in adult, unspecified obesity type (HCC)  OSA (obstructive sleep apnea)  Situational stress  Establishing care with new doctor, encounter for We had a long discussion today about preventative care items that are due.  Right now she is overcome with stress in the situation with her husband.  Therefore I explained to the patient that she does not have to have any of these things done quickly.  I would like her to get a bone density test and I will order that today.  She can return at any time for Pneumovax 23.  I will  give her Prevnar 26 months later.  I recommended Shingrix as well as a COVID booster.  She politely declines the flu shot due to a previous poor reaction.  Mammogram has already been scheduled.  I would like to get a copy of her last colonoscopy to determine when she needs another.  I would also like her to return fasting at her earliest convenience for a CBC CMP and a fasting lipid panel as her chart contains history suggesting hyperlipidemia and prediabetes.  I will give her Xanax 0.5 mg every 8 hours as needed for stress and anxiety.  I encouraged the patient to use this sparingly and to avoid driving or operating heavy machinery after she takes it.  I will give her a Z-Pak to treat her sinusitis.  We also  discussed SSRIs in case her symptoms worsen.

## 2021-01-09 ENCOUNTER — Telehealth: Payer: Self-pay

## 2021-01-09 ENCOUNTER — Other Ambulatory Visit: Payer: PPO

## 2021-01-09 DIAGNOSIS — I10 Essential (primary) hypertension: Secondary | ICD-10-CM | POA: Diagnosis not present

## 2021-01-09 NOTE — Telephone Encounter (Signed)
Forms are needed for patient spouse.   Current message to be closed and new message started under spouse's name.

## 2021-01-09 NOTE — Telephone Encounter (Signed)
Awaiting forms

## 2021-01-09 NOTE — Telephone Encounter (Signed)
Pt came in needing some ppw filled out by pcp. PPW placed in nurse's folder, please contact pt when available to pick up.  Cb#: [phone]

## 2021-01-10 LAB — COMPLETE METABOLIC PANEL WITH GFR
AG Ratio: 1.6 (calc) (ref 1.0–2.5)
ALT: 16 U/L (ref 6–29)
AST: 14 U/L (ref 10–35)
Albumin: 4.4 g/dL (ref 3.6–5.1)
Alkaline phosphatase (APISO): 76 U/L (ref 37–153)
BUN: 21 mg/dL (ref 7–25)
CO2: 29 mmol/L (ref 20–32)
Calcium: 9.5 mg/dL (ref 8.6–10.4)
Chloride: 101 mmol/L (ref 98–110)
Creat: 0.84 mg/dL (ref 0.50–1.05)
Globulin: 2.7 g/dL (calc) (ref 1.9–3.7)
Glucose, Bld: 85 mg/dL (ref 65–99)
Potassium: 4.3 mmol/L (ref 3.5–5.3)
Sodium: 140 mmol/L (ref 135–146)
Total Bilirubin: 0.5 mg/dL (ref 0.2–1.2)
Total Protein: 7.1 g/dL (ref 6.1–8.1)
eGFR: 76 mL/min/{1.73_m2} (ref >=60)

## 2021-01-10 LAB — CBC WITH DIFFERENTIAL/PLATELET
Absolute Monocytes: 536 cells/uL (ref 200–950)
Basophils Absolute: 47 cells/uL (ref 0–200)
Basophils Relative: 0.5 %
Eosinophils Absolute: 66 cells/uL (ref 15–500)
Eosinophils Relative: 0.7 %
HCT: 43 % (ref 35.0–45.0)
Hemoglobin: 14.5 g/dL (ref 11.7–15.5)
Lymphs Abs: 2425 cells/uL (ref 850–3900)
MCH: 30.1 pg (ref 27.0–33.0)
MCHC: 33.7 g/dL (ref 32.0–36.0)
MCV: 89.2 fL (ref 80.0–100.0)
MPV: 9.5 fL (ref 7.5–12.5)
Monocytes Relative: 5.7 %
Neutro Abs: 6326 cells/uL (ref 1500–7800)
Neutrophils Relative %: 67.3 %
Platelets: 319 10*3/uL (ref 140–400)
RBC: 4.82 10*6/uL (ref 3.80–5.10)
RDW: 12.6 % (ref 11.0–15.0)
Total Lymphocyte: 25.8 %
WBC: 9.4 10*3/uL (ref 3.8–10.8)

## 2021-01-10 LAB — LIPID PANEL
Cholesterol: 191 mg/dL (ref <200)
HDL: 54 mg/dL (ref >=50)
LDL Cholesterol (Calc): 117 mg/dL (calc) — ABNORMAL HIGH
Non-HDL Cholesterol (Calc): 137 mg/dL (calc) — ABNORMAL HIGH (ref <130)
Total CHOL/HDL Ratio: 3.5 (calc) (ref <5.0)
Triglycerides: 96 mg/dL (ref <150)

## 2021-01-14 ENCOUNTER — Ambulatory Visit (INDEPENDENT_AMBULATORY_CARE_PROVIDER_SITE_OTHER): Payer: PPO | Admitting: *Deleted

## 2021-01-14 ENCOUNTER — Telehealth: Payer: Self-pay | Admitting: *Deleted

## 2021-01-14 ENCOUNTER — Other Ambulatory Visit: Payer: Self-pay

## 2021-01-14 VITALS — BP 142/98

## 2021-01-14 DIAGNOSIS — I10 Essential (primary) hypertension: Secondary | ICD-10-CM

## 2021-01-14 MED ORDER — LOSARTAN POTASSIUM 100 MG PO TABS
100.0000 mg | ORAL_TABLET | Freq: Every day | ORAL | 1 refills | Status: DC
Start: 2021-01-14 — End: 2021-05-24

## 2021-01-14 NOTE — Telephone Encounter (Signed)
Received call from patient.   Reports that she awoke today and noted that she could feel her pulse in her ears. States that she checked her BP with wrist cuff and noted readings at 194/104. She waited approximately 20 minutes and repeated the BP check. Reading noted at 168/102.  Patient came to office at my request and BP readings in office noted at 148/ 98 (L) arm, 142/ 98 (R) arm.   PCP made aware and advised to begin Losartan 100mg  PO QD.   Prescription sent to pharmacy.   Advised to continue to monitor BP daily and report readings in (1) week.

## 2021-01-17 ENCOUNTER — Other Ambulatory Visit: Payer: Self-pay | Admitting: Family Medicine

## 2021-02-07 ENCOUNTER — Encounter: Payer: Self-pay | Admitting: Family Medicine

## 2021-02-14 ENCOUNTER — Telehealth (INDEPENDENT_AMBULATORY_CARE_PROVIDER_SITE_OTHER): Payer: PPO | Admitting: Nurse Practitioner

## 2021-02-14 ENCOUNTER — Other Ambulatory Visit: Payer: Self-pay

## 2021-02-14 ENCOUNTER — Encounter: Payer: Self-pay | Admitting: Nurse Practitioner

## 2021-02-14 DIAGNOSIS — U071 COVID-19: Secondary | ICD-10-CM | POA: Diagnosis not present

## 2021-02-14 DIAGNOSIS — H6983 Other specified disorders of Eustachian tube, bilateral: Secondary | ICD-10-CM | POA: Diagnosis not present

## 2021-02-14 MED ORDER — FLUTICASONE PROPIONATE 50 MCG/ACT NA SUSP: 2.0000 | Freq: Every day | NASAL | 6 refills | Status: DC

## 2021-02-14 MED ORDER — GUAIFENESIN ER 600 MG PO TB12: 600.0000 mg | ORAL_TABLET | Freq: Two times a day (BID) | ORAL | 0 refills | Status: DC | PRN

## 2021-02-14 NOTE — Progress Notes (Signed)
Subjective:    Patient ID: Anna Francis, female    DOB: [birth year], 70 y.o.   MRN: [id number]  HPI: Anna Francis is a 70 y.o. female presenting for  No chief complaint on file.  UPPER RESPIRATORY TRACT INFECTION Reports her head feels like its a drum. Onset: early November - had zpack  COVID-19 testing history: tested positive last week Tuesday/Wednesday Fever: no Body aches: yes - worse in afternoon Cough: yes; thick white Shortness of breath: no Wheezing: no Chest pain: no Chest tightness: no Chest congestion: no Nasal congestion: yes Runny nose: no Post nasal drip: no Sneezing: no Sore throat: no Swollen glands: no Sinus pressure: no; not as much anymore Headache: yes Face pain: no Toothache: no Ear pain: yes  Ear pressure: yes L > R  Eyes red/itching:no Eye drainage/crusting: no  Nausea: yes; occasionally  Vomiting: no Diarrhea: no  Change in appetite: yes; decreased Loss of taste/smell: no  Rash: no Fatigue: yes Sick contacts: no Strep contacts: no  Context: better Recurrent sinusitis: no Treatments attempted: coricidin HBP, Tylenol Relief with OTC medications: yes  Allergies  Allergen Reactions   Anesthetics, Amide Nausea Only   Encainide Nausea Only   Keflex [Cephalexin]    Latex Other (See Comments)    blisters   Lorabid [Loracarbef] Itching and Other (See Comments)    bleeding   Peanut-Containing Drug Products Other (See Comments)    GI upset   Penicillins Itching   Pork-Derived Products Other (See Comments)    Sore throat   Raspberry Other (See Comments)    redness   Shellfish-Derived Products Other (See Comments)    Sore throat   Other Rash   Strawberry Extract Rash    Outpatient Encounter Medications as of 02/14/2021  Medication Sig   Acetaminophen (TYLENOL PO) Take by mouth as needed.   ALPRAZolam (XANAX) 0.5 MG tablet Take 1 tablet (0.5 mg total) by mouth 3 (three) times daily as needed.   aspirin EC 81 MG tablet  Take 81 mg by mouth daily. Swallow whole.   azithromycin (ZITHROMAX) 250 MG tablet 2 tabs poqday1, 1 tab poqday 2-5   losartan (COZAAR) 100 MG tablet Take 1 tablet (100 mg total) by mouth daily.   Magnesium 400 MG CAPS Take 1 tablet by mouth daily.   nitrofurantoin (MACRODANTIN) 50 MG capsule TAKE ONE CAPSULE BY MOUTH EVERY NIGHT AT BEDTIME   Turmeric (QC TUMERIC COMPLEX PO) Take by mouth.   Vitamin D, Ergocalciferol, (DRISDOL) 1.25 MG (50000 UNIT) CAPS capsule Take 1 capsule (50,000 Units total) by mouth every 7 (seven) days.   No facility-administered encounter medications on file as of 02/14/2021.    Patient Active Problem List   Diagnosis Date Noted   Gross hematuria 02/22/2020   Recurrent UTI 02/22/2020   Palpitations 10/28/2019   HTN (hypertension) 10/28/2019    Past Medical History:  Diagnosis Date   Acid reflux    Angio-edema    Asthma    Back pain    Constipation    Food allergy    GERD (gastroesophageal reflux disease)    Hiatal hernia    Hypertension    IBS (irritable bowel syndrome)    Joint pain    Kidney stone    Kidney stones    Lactose intolerance    Pancreatitis    Pinched nerve    right hip   PONV (postoperative nausea and vomiting)    Ruptured thoracic disc    Sleep apnea  has not used CPAP in a year   Spinal stenosis    Swallowing difficulty    Tachycardia    Urinary problem in female    Urticaria    Vitamin D deficiency     Relevant past medical, surgical, family and social history reviewed and updated as indicated. Interim medical history since our last visit reviewed.  Review of Systems Per HPI unless specifically indicated above     Objective:    There were no vitals taken for this visit.  Wt Readings from Last 3 Encounters:  01/08/21 204 lb (92.5 kg)  07/09/20 203 lb (92.1 kg)  04/09/20 207 lb (93.9 kg)    Physical Exam Vitals and nursing note reviewed.  Constitutional:      General: She is not in acute distress.     Appearance: Normal appearance. She is not ill-appearing or toxic-appearing.  HENT:     Head: Normocephalic and atraumatic.     Nose: Nose normal. No congestion or rhinorrhea.     Mouth/Throat:     Mouth: Mucous membranes are moist.     Pharynx: Oropharynx is clear.  Eyes:     General: No scleral icterus.       Right eye: No discharge.        Left eye: No discharge.     Extraocular Movements: Extraocular movements intact.  Cardiovascular:     Comments: Unable to assess heart sounds via virtual visit. Pulmonary:     Effort: Pulmonary effort is normal. No respiratory distress.     Comments: Unable to assess breath sounds via virtual visit.  Patient talking in complete sentences during telemedicine visit without accessory muscle use. Skin:    Coloration: Skin is not jaundiced or pale.     Findings: No erythema.  Neurological:     Mental Status: She is alert and oriented to person, place, and time.      Assessment & Plan:  1. COVID-19 Acute, tested positive about 1 week ago.  Symptoms appear to be resolving.  Continue pushing fluids.  Start Mucinex.  Start flonase 2 sprays twice daily.  If no improvement by Friday, can consider steroid to help with inflammation.  No s/s bacterial infection today - this was discussed with the patient.  - guaiFENesin (MUCINEX) 600 MG 12 hr tablet; Take 1 tablet (600 mg total) by mouth 2 (two) times daily as needed for cough or to loosen phlegm.  Dispense: 30 tablet; Refill: 0  2. Dysfunction of both eustachian tubes Secondary to COVID-19 infection.  Start flonase 2 sprays twice daily. Follow up if symptoms not improved by Friday.   - fluticasone (FLONASE) 50 MCG/ACT nasal spray; Place 2 sprays into both nostrils daily.  Dispense: 16 g; Refill: 6    Follow up plan: No follow-ups on file.  Due to the catastrophic nature of the COVID-19 pandemic, this video visit was completed soley via audio and visual contact via [facility] due to the restrictions of  the COVID-19 pandemic.  All issues as above were discussed and addressed. Physical exam was done as above through visual confirmation on [employer]. If it was felt that the patient should be evaluated in the office, they were directed there. The patient verbally consented to this visit. Location of the patient: home Location of the provider: work Those involved with this call:  Provider: Noemi Chapel, DNP, FNP-C CMA: n/a Front Desk/Registration: Vevelyn Pat  Time spent on call:  9 minutes with patient face to face via video conference. More  than 50% of this time was spent in counseling and coordination of care. 15 minutes total spent in review of patient's record and preparation of their chart. I verified patient identity using two factors (patient name and date of birth). Patient consents verbally to being seen via telemedicine visit today.

## 2021-02-18 ENCOUNTER — Encounter: Payer: Self-pay | Admitting: Nurse Practitioner

## 2021-02-18 DIAGNOSIS — H6983 Other specified disorders of Eustachian tube, bilateral: Secondary | ICD-10-CM

## 2021-02-18 MED ORDER — PREDNISONE 20 MG PO TABS: 20.0000 mg | ORAL_TABLET | Freq: Every day | ORAL | 0 refills | Status: AC

## 2021-03-04 DIAGNOSIS — H2513 Age-related nuclear cataract, bilateral: Secondary | ICD-10-CM | POA: Diagnosis not present

## 2021-03-04 DIAGNOSIS — H524 Presbyopia: Secondary | ICD-10-CM | POA: Diagnosis not present

## 2021-04-22 ENCOUNTER — Encounter: Payer: Self-pay | Admitting: Family Medicine

## 2021-05-24 ENCOUNTER — Other Ambulatory Visit: Payer: Self-pay

## 2021-05-24 ENCOUNTER — Ambulatory Visit (INDEPENDENT_AMBULATORY_CARE_PROVIDER_SITE_OTHER): Payer: PPO

## 2021-05-24 VITALS — BP 126/84 | HR 67 | Ht 62.0 in | Wt 204.0 lb

## 2021-05-24 DIAGNOSIS — Z1231 Encounter for screening mammogram for malignant neoplasm of breast: Secondary | ICD-10-CM | POA: Diagnosis not present

## 2021-05-24 DIAGNOSIS — Z0000 Encounter for general adult medical examination without abnormal findings: Secondary | ICD-10-CM

## 2021-05-24 DIAGNOSIS — Z1211 Encounter for screening for malignant neoplasm of colon: Secondary | ICD-10-CM

## 2021-05-24 DIAGNOSIS — Z780 Asymptomatic menopausal state: Secondary | ICD-10-CM | POA: Diagnosis not present

## 2021-05-24 NOTE — Patient Instructions (Signed)
[name] , Thank you for taking time to come for your Medicare Wellness Visit. I appreciate your ongoing commitment to your health goals. Please review the following plan we discussed and let me know if I can assist you in the future.   Screening recommendations/referrals: Colonoscopy: Order placed today. Mammogram: Order placed today Bone Density: Order placed today.  Recommended yearly ophthalmology/optometry visit for glaucoma screening and checkup Recommended yearly dental visit for hygiene and checkup  Vaccinations: Influenza vaccine: Declined due to allergies.  Pneumococcal vaccine: Discussed. Tdap vaccine: Due Repeat in 10 years  Shingles vaccine: Discussed.    Covid-19:Done 06/11/2019 and 07/12/2019.  Advanced directives: Please bring a copy of your health care power of attorney and living will to the office to be added to your chart at your convenience.   Conditions/risks identified: Aim for 30 minutes of exercise or brisk walking, 6-8 glasses of water, and 5 servings of fruits and vegetables each day.   Next appointment: Follow up in one year for your annual wellness visit 2024.   Preventive Care 23 Years and Older, Female Preventive care refers to lifestyle choices and visits with your health care provider that can promote health and wellness. What does preventive care include? A yearly physical exam. This is also called an annual well check. Dental exams once or twice a year. Routine eye exams. Ask your health care provider how often you should have your eyes checked. Personal lifestyle choices, including: Daily care of your teeth and gums. Regular physical activity. Eating a healthy diet. Avoiding tobacco and drug use. Limiting alcohol use. Practicing safe sex. Taking low-dose aspirin every day. Taking vitamin and mineral supplements as recommended by your health care provider. What happens during an annual well check? The services and screenings done by  your health care provider during your annual well check will depend on your age, overall health, lifestyle risk factors, and family history of disease. Counseling  Your health care provider may ask you questions about your: Alcohol use. Tobacco use. Drug use. Emotional well-being. Home and relationship well-being. Sexual activity. Eating habits. History of falls. Memory and ability to understand (cognition). Work and work Statistician. Reproductive health. Screening  You may have the following tests or measurements: Height, weight, and BMI. Blood pressure. Lipid and cholesterol levels. These may be checked every 5 years, or more frequently if you are over 42 years old. Skin check. Lung cancer screening. You may have this screening every year starting at age 33 if you have a 30-pack-year history of smoking and currently smoke or have quit within the past 15 years. Fecal occult blood test (FOBT) of the stool. You may have this test every year starting at age 38. Flexible sigmoidoscopy or colonoscopy. You may have a sigmoidoscopy every 5 years or a colonoscopy every 10 years starting at age 18. Hepatitis C blood test. Hepatitis B blood test. Sexually transmitted disease (STD) testing. Diabetes screening. This is done by checking your blood sugar (glucose) after you have not eaten for a while (fasting). You may have this done every 1-3 years. Bone density scan. This is done to screen for osteoporosis. You may have this done starting at age 27. Mammogram. This may be done every 1-2 years. Talk to your health care provider about how often you should have regular mammograms. Talk with your health care provider about your test results, treatment options, and if necessary, the need for more tests. Vaccines  Your health care provider may recommend certain vaccines, such as:  Influenza vaccine. This is recommended every year. Tetanus, diphtheria, and acellular pertussis (Tdap, Td) vaccine. You  may need a Td booster every 10 years. Zoster vaccine. You may need this after age 44. Pneumococcal 13-valent conjugate (PCV13) vaccine. One dose is recommended after age 27. Pneumococcal polysaccharide (PPSV23) vaccine. One dose is recommended after age 7. Talk to your health care provider about which screenings and vaccines you need and how often you need them. This information is not intended to replace advice given to you by your health care provider. Make sure you discuss any questions you have with your health care provider. Document Released: 03/30/2015 Document Revised: 11/21/2015 Document Reviewed: 01/02/2015 Elsevier Interactive Patient Education  2017 [place] Prevention in the Home Falls can cause injuries. They can happen to people of all ages. There are many things you can do to make your home safe and to help prevent falls. What can I do on the outside of my home? Regularly fix the edges of walkways and driveways and fix any cracks. Remove anything that might make you trip as you walk through a door, such as a raised step or threshold. Trim any bushes or trees on the path to your home. Use bright outdoor lighting. Clear any walking paths of anything that might make someone trip, such as rocks or tools. Regularly check to see if handrails are loose or broken. Make sure that both sides of any steps have handrails. Any raised decks and porches should have guardrails on the edges. Have any leaves, snow, or ice cleared regularly. Use sand or salt on walking paths during winter. Clean up any spills in your garage right away. This includes oil or grease spills. What can I do in the bathroom? Use night lights. Install grab bars by the toilet and in the tub and shower. Do not use towel bars as grab bars. Use non-skid mats or decals in the tub or shower. If you need to sit down in the shower, use a plastic, non-slip stool. Keep the floor dry. Clean up any water that spills  on the floor as soon as it happens. Remove soap buildup in the tub or shower regularly. Attach bath mats securely with double-sided non-slip rug tape. Do not have throw rugs and other things on the floor that can make you trip. What can I do in the bedroom? Use night lights. Make sure that you have a light by your bed that is easy to reach. Do not use any sheets or blankets that are too big for your bed. They should not hang down onto the floor. Have a firm chair that has side arms. You can use this for support while you get dressed. Do not have throw rugs and other things on the floor that can make you trip. What can I do in the kitchen? Clean up any spills right away. Avoid walking on wet floors. Keep items that you use a lot in easy-to-reach places. If you need to reach something above you, use a strong step stool that has a grab bar. Keep electrical cords out of the way. Do not use floor polish or wax that makes floors slippery. If you must use wax, use non-skid floor wax. Do not have throw rugs and other things on the floor that can make you trip. What can I do with my stairs? Do not leave any items on the stairs. Make sure that there are handrails on both sides of the stairs and use them.  Fix handrails that are broken or loose. Make sure that handrails are as long as the stairways. Check any carpeting to make sure that it is firmly attached to the stairs. Fix any carpet that is loose or worn. Avoid having throw rugs at the top or bottom of the stairs. If you do have throw rugs, attach them to the floor with carpet tape. Make sure that you have a light switch at the top of the stairs and the bottom of the stairs. If you do not have them, ask someone to add them for you. What else can I do to help prevent falls? Wear shoes that: Do not have high heels. Have rubber bottoms. Are comfortable and fit you well. Are closed at the toe. Do not wear sandals. If you use a stepladder: Make  sure that it is fully opened. Do not climb a closed stepladder. Make sure that both sides of the stepladder are locked into place. Ask someone to hold it for you, if possible. Clearly mark and make sure that you can see: Any grab bars or handrails. First and last steps. Where the edge of each step is. Use tools that help you move around (mobility aids) if they are needed. These include: Canes. Walkers. Scooters. Crutches. Turn on the lights when you go into a dark area. Replace any light bulbs as soon as they burn out. Set up your furniture so you have a clear path. Avoid moving your furniture around. If any of your floors are uneven, fix them. If there are any pets around you, be aware of where they are. Review your medicines with your doctor. Some medicines can make you feel dizzy. This can increase your chance of falling. Ask your doctor what other things that you can do to help prevent falls. This information is not intended to replace advice given to you by your health care provider. Make sure you discuss any questions you have with your health care provider. Document Released: 12/28/2008 Document Revised: 08/09/2015 Document Reviewed: 04/07/2014 Elsevier Interactive Patient Education  2017 [employer].

## 2021-05-24 NOTE — Progress Notes (Signed)
Subjective:   Anna Francis is a 71 y.o. female who presents for an Initial Medicare Annual Wellness Visit.  Review of Systems     Cardiac Risk Factors include: advanced age (>8mn, >>41women);hypertension;obesity (BMI >30kg/m2)     Objective:    Today's Vitals   05/24/21 1431  BP: 126/84  Pulse: 67  SpO2: 98%  Weight: 204 lb (92.5 kg)  Height: '5\' 2"'$  (1.575 m)   Body mass index is 37.31 kg/m.  Advanced Directives 05/24/2021 03/02/2012 02/27/2012  Does Patient Have a Medical Advance Directive? Yes - Patient has advance directive, copy not in chart  Type of Advance Directive [place] will - [place] will  Copy of HEmoryin Chart? No - copy requested - Copy requested from family  Pre-existing out of facility DNR order (yellow form or pink MOST form) - No -    Current Medications (verified) Outpatient Encounter Medications as of 05/24/2021  Medication Sig   Acetaminophen (TYLENOL PO) Take by mouth as needed.   aspirin EC 81 MG tablet Take 81 mg by mouth daily. Swallow whole.   cholecalciferol (VITAMIN D3) 25 MCG (1000 UNIT) tablet Take 500 Units by mouth daily.   fluticasone (FLONASE) 50 MCG/ACT nasal spray Place 2 sprays into both nostrils daily.   mupirocin ointment (BACTROBAN) 2 % Apply topically 2 (two) times daily.   [DISCONTINUED] ALPRAZolam (XANAX) 0.5 MG tablet Take 1 tablet (0.5 mg total) by mouth 3 (three) times daily as needed.   [DISCONTINUED] guaiFENesin (MUCINEX) 600 MG 12 hr tablet Take 1 tablet (600 mg total) by mouth 2 (two) times daily as needed for cough or to loosen phlegm.   [DISCONTINUED] losartan (COZAAR) 100 MG tablet Take 1 tablet (100 mg total) by mouth daily.   [DISCONTINUED] Magnesium 400 MG CAPS Take 1 tablet by mouth daily.   [DISCONTINUED] nitrofurantoin (MACRODANTIN) 50 MG capsule TAKE ONE CAPSULE BY MOUTH EVERY NIGHT AT BEDTIME   [DISCONTINUED] Vitamin D,  Ergocalciferol, (DRISDOL) 1.25 MG (50000 UNIT) CAPS capsule Take 1 capsule (50,000 Units total) by mouth every 7 (seven) days.   No facility-administered encounter medications on file as of 05/24/2021.    Allergies (verified) Anesthetics, amide; Encainide; Keflex [cephalexin]; Latex; Lorabid [loracarbef]; and Penicillins   History: Past Medical History:  Diagnosis Date   Acid reflux    Angio-edema    Asthma    Back pain    Constipation    Food allergy    GERD (gastroesophageal reflux disease)    Hiatal hernia    Hypertension    IBS (irritable bowel syndrome)    Joint pain    Kidney stone    Kidney stones    Lactose intolerance    Pancreatitis    Pinched nerve    right hip   PONV (postoperative nausea and vomiting)    Ruptured thoracic disc    Sleep apnea    has not used CPAP in a year   Spinal stenosis    Swallowing difficulty    Tachycardia    Urinary problem in female    Urticaria    Vitamin D deficiency    Past Surgical History:  Procedure Laterality Date   ABDOMINAL HYSTERECTOMY  1998   BLADDER SURGERY  1998   CYST REMOVAL HAND  1980's   FOOT SURGERY Left 1980's   MANDIBLE FRACTURE SURGERY  1979   NASAL SINUS SURGERY     ROTATOR CUFF REPAIR  left shoulder   SHOULDER ARTHROSCOPY  03/02/2012   Procedure: ARTHROSCOPY SHOULDER;  Surgeon: Meredith Pel, MD;  Location: [facility];  Service: Orthopedics;  Laterality: Right;  Right shoulder diagnostic operative arthroscopy, rotator interval release manipulation   Family History  Problem Relation Age of Onset   Diabetes Mother    Hypertension Mother    Heart disease Mother    Thyroid disease Mother    Cancer Mother    Liver disease Mother    Sleep apnea Mother    Alcoholism Mother    Obesity Mother    Allergy (severe) Mother        latex, antibiotics   Diabetes Father    Hypertension Father    Hyperlipidemia Father    Stroke Father    Allergy (severe) Father        foods and medications   Social  History   Socioeconomic History   Marital status: Widowed    Spouse name: Roselyn Reef   Number of children: Not on file   Years of education: Not on file   Highest education level: Not on file  Occupational History   Occupation: Clinical cytogeneticist  Tobacco Use   Smoking status: Never   Smokeless tobacco: Never  Vaping Use   Vaping Use: Never used  Substance and Sexual Activity   Alcohol use: No   Drug use: No   Sexual activity: Not on file  Other Topics Concern   Not on file  Social History Narrative   Not on file   Social Determinants of Health   Financial Resource Strain: Low Risk    Difficulty of Paying Living Expenses: Not hard at all  Food Insecurity: No Food Insecurity   Worried About Charity fundraiser in the Last Year: Never true   [place] in the Last Year: Never true  Transportation Needs: No Transportation Needs   Lack of Transportation (Medical): No   Lack of Transportation (Non-Medical): No  Physical Activity: Sufficiently Active   Days of Exercise per Week: 5 days   Minutes of Exercise per Session: 40 min  Stress: No Stress Concern Present   Feeling of Stress : Only a little  Social Connections: Moderately Integrated   Frequency of Communication with Friends and Family: More than three times a week   Frequency of Social Gatherings with Friends and Family: More than three times a week   Attends Religious Services: More than 4 times per year   Active Member of [employer] or Organizations: Yes   Attends Archivist Meetings: More than 4 times per year   Marital Status: Widowed    Tobacco Counseling Counseling given: Not Answered   Clinical Intake:  Pre-visit preparation completed: Yes  Pain : No/denies pain     BMI - recorded: 37.31 Nutritional Status: BMI > 30  Obese Nutritional Risks: None Diabetes: No  How often do you need to have someone help you when you read instructions, pamphlets, or other written materials from your doctor or  pharmacy?: 1 - Never  Diabetic?No  Interpreter Needed?: No  Information entered by :: mj Maelie Chriswell, pn   Activities of Daily Living In your present state of health, do you have any difficulty performing the following activities: 05/24/2021  Hearing? N  Vision? N  Difficulty concentrating or making decisions? Y  Comment at times.  Walking or climbing stairs? N  Dressing or bathing? N  Doing errands, shopping? N  Preparing Food and eating ? N  Using  the Toilet? N  In the past six months, have you accidently leaked urine? N  Do you have problems with loss of bowel control? N  Managing your Medications? N  Managing your Finances? N  Housekeeping or managing your Housekeeping? N  Some recent data might be hidden    Patient Care Team: Susy Frizzle, MD as PCP - General (Family Medicine)  Indicate any recent Medical Services you may have received from other than [facility] providers in the past year (date may be approximate).     Assessment:   This is a routine wellness examination for Jenevieve.  Hearing/Vision screen Hearing Screening - Comments:: No hearing issues.  Vision Screening - Comments:: Glasses. Dr. Valetta Close. 02/2021.  Dietary issues and exercise activities discussed: Current Exercise Habits: Home exercise routine, Type of exercise: walking, Time (Minutes): 40, Frequency (Times/Week): 5, Weekly Exercise (Minutes/Week): 200, Intensity: Mild, Exercise limited by: cardiac condition(s)   Goals Addressed             This Visit's Progress    Weight (lb) < 200 lb (90.7 kg)   204 lb (92.5 kg)    Exercise more and lose weight.       Depression Screen PHQ 2/9 Scores 05/24/2021 10/06/2019  PHQ - 2 Score 1 4  PHQ- 9 Score - 17    Fall Risk Fall Risk  05/24/2021  Falls in the past year? 0  Number falls in past yr: 0  Injury with Fall? 0  Risk for fall due to : No Fall Risks  Follow up Falls prevention discussed    FALL RISK PREVENTION PERTAINING TO THE HOME:  Any  stairs in or around the home? Yes  If so, are there any without handrails? No  Home free of loose throw rugs in walkways, pet beds, electrical cords, etc? Yes  Adequate lighting in your home to reduce risk of falls? Yes   ASSISTIVE DEVICES UTILIZED TO PREVENT FALLS:  Life alert? Yes  Use of a cane, walker or w/c? No  Grab bars in the bathroom? Yes  Shower chair or bench in shower? No  Elevated toilet seat or a handicapped toilet? No   TIMED UP AND GO:  Was the test performed? Yes .  Length of time to ambulate 10 feet: 10 sec.   Gait steady and fast without use of assistive device  Cognitive Function:     6CIT Screen 05/24/2021  What Year? 0 points  What month? 0 points  What time? 0 points  Count back from 20 0 points  Months in reverse 0 points  Repeat phrase 0 points  Total Score 0    Immunizations Immunization History  Administered Date(s) Administered   PFIZER(Purple Top)SARS-COV-2 Vaccination 06/11/2019, 07/12/2019    TDAP status: Due, Education has been provided regarding the importance of this vaccine. Advised may receive this vaccine at local pharmacy or Health Dept. Aware to provide a copy of the vaccination record if obtained from local pharmacy or Health Dept. Verbalized acceptance and understanding.  Flu Vaccine status: Declined, Education has been provided regarding the importance of this vaccine but patient still declined. Advised may receive this vaccine at local pharmacy or Health Dept. Aware to provide a copy of the vaccination record if obtained from local pharmacy or Health Dept. Verbalized acceptance and understanding.  Pneumococcal vaccine status: Due, Education has been provided regarding the importance of this vaccine. Advised may receive this vaccine at local pharmacy or Health Dept. Aware to provide a copy of  the vaccination record if obtained from local pharmacy or Health Dept. Verbalized acceptance and understanding.  Covid-19 vaccine status:  Completed vaccines  Qualifies for Shingles Vaccine? Yes   Zostavax completed Yes   Shingrix Completed?: No.    Education has been provided regarding the importance of this vaccine. Patient has been advised to call insurance company to determine out of pocket expense if they have not yet received this vaccine. Advised may also receive vaccine at local pharmacy or Health Dept. Verbalized acceptance and understanding.  Screening Tests Health Maintenance  Topic Date Due   Hepatitis C Screening  Never done   TETANUS/TDAP  Never done   Zoster Vaccines- Shingrix (1 of 2) Never done   MAMMOGRAM  08/15/2016   Pneumonia Vaccine 70+ Years old (1 - PCV) Never done   DEXA SCAN  06/20/2017   COVID-19 Vaccine (3 - Pfizer risk series) 08/09/2019   INFLUENZA VACCINE  Never done   COLONOSCOPY (Pts 45-31yr Insurance coverage will need to be confirmed)  01/04/2030   HPV VACCINES  Aged Out    Health Maintenance  Health Maintenance Due  Topic Date Due   Hepatitis C Screening  Never done   TETANUS/TDAP  Never done   Zoster Vaccines- Shingrix (1 of 2) Never done   MAMMOGRAM  08/15/2016   Pneumonia Vaccine 69 Years old (1 - PCV) Never done   DEXA SCAN  06/20/2017   COVID-19 Vaccine (3 - Pfizer risk series) 08/09/2019   INFLUENZA VACCINE  Never done    Colorectal cancer screening: Referral to GI placed 05/24/2021. Pt aware the office will call re: appt.  Mammogram status: Ordered 05/24/21. Pt provided with contact info and advised to call to schedule appt.   Bone Density status: Ordered 05/24/21. Pt provided with contact info and advised to call to schedule appt.  Lung Cancer Screening: (Low Dose CT Chest recommended if Age 69-80years, 30 pack-year currently smoking OR have quit w/in 15years.) does not qualify.   Additional Screening:  Hepatitis C Screening: does qualify; Completed Due  Vision Screening: Recommended annual ophthalmology exams for early detection of glaucoma and other disorders  of the eye. Is the patient up to date with their annual eye exam?  Yes  Who is the provider or what is the name of the office in which the patient attends annual eye exams? Dr. BValetta CloseIf pt is not established with a provider, would they like to be referred to a provider to establish care? No .   Dental Screening: Recommended annual dental exams for proper oral hygiene  Community Resource Referral / Chronic Care Management: CRR required this visit?  No   CCM required this visit?  No      Plan:     I have personally reviewed and noted the following in the patients chart:   Medical and social history Use of alcohol, tobacco or illicit drugs  Current medications and supplements including opioid prescriptions. Patient is not currently taking opioid prescriptions. Functional ability and status Nutritional status Physical activity Advanced directives List of other physicians Hospitalizations, surgeries, and ER visits in previous 12 months Vitals Screenings to include cognitive, depression, and falls Referrals and appointments  In addition, I have reviewed and discussed with patient certain preventive protocols, quality metrics, and best practice recommendations. A written personalized care plan for preventive services as well as general preventive health recommendations were provided to patient.     MChriss Driver LPN   [id number]  Nurse Notes:  Discussed mammogram, Dexa and colonoscopy. Pt would like to repeat. Orders placed today for all. Discussed Tdap, Prevnar and Shingrix vaccines and how to obtain.

## 2021-05-28 ENCOUNTER — Other Ambulatory Visit: Payer: Self-pay | Admitting: Family Medicine

## 2021-05-28 DIAGNOSIS — Z1231 Encounter for screening mammogram for malignant neoplasm of breast: Secondary | ICD-10-CM

## 2021-05-28 DIAGNOSIS — Z780 Asymptomatic menopausal state: Secondary | ICD-10-CM

## 2021-05-30 ENCOUNTER — Ambulatory Visit (INDEPENDENT_AMBULATORY_CARE_PROVIDER_SITE_OTHER): Payer: PPO | Admitting: Family Medicine

## 2021-05-30 ENCOUNTER — Encounter: Payer: Self-pay | Admitting: Family Medicine

## 2021-05-30 ENCOUNTER — Other Ambulatory Visit: Payer: Self-pay

## 2021-05-30 VITALS — BP 128/88 | HR 67 | Temp 97.2°F | Resp 18 | Ht 62.0 in | Wt 205.0 lb

## 2021-05-30 DIAGNOSIS — R7303 Prediabetes: Secondary | ICD-10-CM | POA: Diagnosis not present

## 2021-05-30 DIAGNOSIS — Z6837 Body mass index (BMI) 37.0-37.9, adult: Secondary | ICD-10-CM | POA: Diagnosis not present

## 2021-05-30 NOTE — Progress Notes (Signed)
? ?Subjective:  ? ? Patient ID: Anna Francis, female    DOB: Dec 18, 1950, 71 y.o.   MRN: [id number] ? ?HPI ?Patient is a very sweet 71 year old Caucasian female who presents today to discuss weight loss options.  She states that in the past she was a prediabetic.  We have not checked her lab work since last fall.  At that time her sugar was normal however she was grieving the death of her husband and was not eating.  She has since returned back to a normal diet.  She denies any polyuria, polydipsia, blurry vision.  She is very interested in medication to help with weight loss. ?Past Medical History:  ?Diagnosis Date  ? Acid reflux   ? Angio-edema   ? Asthma   ? Back pain   ? Constipation   ? Food allergy   ? GERD (gastroesophageal reflux disease)   ? Hiatal hernia   ? Hypertension   ? IBS (irritable bowel syndrome)   ? Joint pain   ? Kidney stone   ? Kidney stones   ? Lactose intolerance   ? Pancreatitis   ? Pinched nerve   ? right hip  ? PONV (postoperative nausea and vomiting)   ? Ruptured thoracic disc   ? Sleep apnea   ? has not used CPAP in a year  ? Spinal stenosis   ? Swallowing difficulty   ? Tachycardia   ? Urinary problem in female   ? Urticaria   ? Vitamin D deficiency   ? ?Past Surgical History:  ?Procedure Laterality Date  ? ABDOMINAL HYSTERECTOMY  1998  ? BLADDER SURGERY  1998  ? CYST REMOVAL HAND  1980's  ? FOOT SURGERY Left 1980's  ? [place]  ? NASAL SINUS SURGERY    ? ROTATOR CUFF REPAIR    ? left shoulder  ? SHOULDER ARTHROSCOPY  03/02/2012  ? Procedure: ARTHROSCOPY SHOULDER;  Surgeon: Meredith Pel, MD;  Location: [facility];  Service: Orthopedics;  Laterality: Right;  Right shoulder diagnostic operative arthroscopy, rotator interval release manipulation  ? ?Current Outpatient Medications on File Prior to Visit  ?Medication Sig Dispense Refill  ? aspirin EC 81 MG tablet Take 81 mg by mouth daily. Swallow whole. (Patient not taking: Reported on 05/30/2021)    ?  cholecalciferol (VITAMIN D3) 25 MCG (1000 UNIT) tablet Take 500 Units by mouth daily. (Patient not taking: Reported on 05/30/2021)    ? ?No current facility-administered medications on file prior to visit.  ? ?Allergies  ?Allergen Reactions  ? Anesthetics, Amide Nausea Only  ? Encainide Nausea Only  ? Keflex [Cephalexin]   ? Latex Other (See Comments)  ?  blisters  ? Lorabid [Loracarbef] Itching and Other (See Comments)  ?  bleeding  ? Penicillins Itching  ? ?Social History  ? ?Socioeconomic History  ? Marital status: Widowed  ?  Spouse name: Roselyn Reef  ? Number of children: 2  ? Years of education: Not on file  ? Highest education level: Not on file  ?Occupational History  ? Occupation: Clinical cytogeneticist  ?Tobacco Use  ? Smoking status: Never  ? Smokeless tobacco: Never  ?Vaping Use  ? Vaping Use: Never used  ?Substance and Sexual Activity  ? Alcohol use: No  ? Drug use: No  ? Sexual activity: Not Currently  ?Other Topics Concern  ? Not on file  ?Social History Narrative  ? Husband passed 01/2021. Daughter and grandsons live with patient.   ? 1  son and 1 daughter   ? 4 grandchildren  ? ?Social Determinants of Health  ? ?Financial Resource Strain: Low Risk   ? Difficulty of Paying Living Expenses: Not hard at all  ?Food Insecurity: No Food Insecurity  ? Worried About Charity fundraiser in the Last Year: Never true  ? Ran Out of Food in the Last Year: Never true  ?Transportation Needs: No Transportation Needs  ? Lack of Transportation (Medical): No  ? Lack of Transportation (Non-Medical): No  ?Physical Activity: Sufficiently Active  ? Days of Exercise per Week: 5 days  ? Minutes of Exercise per Session: 40 min  ?Stress: No Stress Concern Present  ? Feeling of Stress : Only a little  ?Social Connections: Moderately Integrated  ? Frequency of Communication with Friends and Family: More than three times a week  ? Frequency of Social Gatherings with Friends and Family: More than three times a week  ? Attends Religious Services:  More than 4 times per year  ? Active Member of Clubs or Organizations: Yes  ? Attends Archivist Meetings: More than 4 times per year  ? Marital Status: Widowed  ?Intimate Partner Violence: Not At Risk  ? Fear of Current or Ex-Partner: No  ? Emotionally Abused: No  ? Physically Abused: No  ? Sexually Abused: No  ? ?Family History  ?Problem Relation Age of Onset  ? Diabetes Mother   ? Hypertension Mother   ? Heart disease Mother   ? Thyroid disease Mother   ? Cancer Mother   ? Liver disease Mother   ? Sleep apnea Mother   ? Alcoholism Mother   ? Obesity Mother   ? Allergy (severe) Mother   ?     latex, antibiotics  ? Diabetes Father   ? Hypertension Father   ? Hyperlipidemia Father   ? Stroke Father   ? Allergy (severe) Father   ?     foods and medications  ? ? ? ? ?Review of Systems ? ?   ?Objective:  ? Physical Exam ?Vitals reviewed.  ?Constitutional:   ?   General: She is not in acute distress. ?   Appearance: Normal appearance. She is obese. She is not ill-appearing, toxic-appearing or diaphoretic.  ?HENT:  ?   Head: Normocephalic and atraumatic.  ?Cardiovascular:  ?   Rate and Rhythm: Normal rate and regular rhythm.  ?   Pulses: Normal pulses.  ?   Heart sounds: Normal heart sounds. No murmur heard. ?  No friction rub. No gallop.  ?Pulmonary:  ?   Effort: Pulmonary effort is normal. No respiratory distress.  ?   Breath sounds: Normal breath sounds. No stridor. No wheezing, rhonchi or rales.  ?Musculoskeletal:  ?   Right lower leg: No edema.  ?   Left lower leg: No edema.  ?Neurological:  ?   Mental Status: She is alert.  ?Psychiatric:     ?   Mood and Affect: Mood normal.     ?   Behavior: Behavior normal.     ?   Thought Content: Thought content normal.     ?   Judgment: Judgment normal.  ? ? ? ? ?   ?Assessment & Plan:  ? ?Prediabetes - Plan: Hemoglobin A1c, COMPLETE METABOLIC PANEL WITH GFR ? ?BMI 37.0-37.9, adult ?We discussed medications for weight loss including Adipex, Topamax, and a GLP-1  agonist such as Ozempic, Trulicity and even Mounjaro.  We discussed that Medicare will not pay for  weight loss medication.  The only cheap affordable option would be Adipex.  We discussed the potential cardiovascular risk of this.  Despite the patient's age, I think she would be very low risk for any complications from this medication and this would be certainly an affordable option to be used short-term.  However, ideally, I would like the patient on Ozempic.  She has a history of prediabetes so we will check an A1c.  If her A1c is elevated, I believe she would be an excellent candidate for Ozempic to try to help control her sugars and facilitate weight loss.  Await results of her lab work. ?

## 2021-05-31 LAB — COMPLETE METABOLIC PANEL WITH GFR
AG Ratio: 1.7 (calc) (ref 1.0–2.5)
ALT: 14 U/L (ref 6–29)
AST: 12 U/L (ref 10–35)
Albumin: 4.2 g/dL (ref 3.6–5.1)
Alkaline phosphatase (APISO): 62 U/L (ref 37–153)
BUN/Creatinine Ratio: 20 (calc) (ref 6–22)
BUN: 22 mg/dL (ref 7–25)
CO2: 28 mmol/L (ref 20–32)
Calcium: 9.6 mg/dL (ref 8.6–10.4)
Chloride: 101 mmol/L (ref 98–110)
Creat: 1.08 mg/dL — ABNORMAL HIGH (ref 0.50–1.05)
Globulin: 2.5 g/dL (calc) (ref 1.9–3.7)
Glucose, Bld: 81 mg/dL (ref 65–99)
Potassium: 4.3 mmol/L (ref 3.5–5.3)
Sodium: 140 mmol/L (ref 135–146)
Total Bilirubin: 0.4 mg/dL (ref 0.2–1.2)
Total Protein: 6.7 g/dL (ref 6.1–8.1)
eGFR: 56 mL/min/{1.73_m2} — ABNORMAL LOW (ref >=60)

## 2021-05-31 LAB — HEMOGLOBIN A1C
Hgb A1c MFr Bld: 5.4 % of total Hgb (ref <5.7)
Mean Plasma Glucose: 108 mg/dL
eAG (mmol/L): 6 mmol/L

## 2021-06-03 ENCOUNTER — Other Ambulatory Visit: Payer: Self-pay | Admitting: Family Medicine

## 2021-06-03 MED ORDER — PHENTERMINE HCL 37.5 MG PO TABS: 37.5000 mg | ORAL_TABLET | Freq: Every day | ORAL | 2 refills | Status: DC

## 2021-06-04 DIAGNOSIS — D485 Neoplasm of uncertain behavior of skin: Secondary | ICD-10-CM | POA: Diagnosis not present

## 2021-06-04 DIAGNOSIS — L821 Other seborrheic keratosis: Secondary | ICD-10-CM | POA: Diagnosis not present

## 2021-06-04 DIAGNOSIS — D225 Melanocytic nevi of trunk: Secondary | ICD-10-CM | POA: Diagnosis not present

## 2021-06-04 DIAGNOSIS — L814 Other melanin hyperpigmentation: Secondary | ICD-10-CM | POA: Diagnosis not present

## 2021-06-04 DIAGNOSIS — Z85820 Personal history of malignant melanoma of skin: Secondary | ICD-10-CM | POA: Diagnosis not present

## 2021-06-04 DIAGNOSIS — Z85828 Personal history of other malignant neoplasm of skin: Secondary | ICD-10-CM | POA: Diagnosis not present

## 2021-06-05 ENCOUNTER — Encounter: Payer: Self-pay | Admitting: Gastroenterology

## 2021-06-07 ENCOUNTER — Other Ambulatory Visit: Payer: Self-pay

## 2021-06-07 ENCOUNTER — Ambulatory Visit
Admission: RE | Admit: 2021-06-07 | Discharge: 2021-06-07 | Disposition: A | Discharge status: Home / Self Care | Payer: PPO | Source: Ambulatory Visit | Attending: Family Medicine | Admitting: Family Medicine

## 2021-06-07 DIAGNOSIS — Z1231 Encounter for screening mammogram for malignant neoplasm of breast: Secondary | ICD-10-CM

## 2021-06-14 ENCOUNTER — Encounter: Payer: Self-pay | Admitting: Gastroenterology

## 2021-06-14 ENCOUNTER — Other Ambulatory Visit: Payer: PPO

## 2021-06-14 ENCOUNTER — Ambulatory Visit: Payer: PPO | Admitting: Gastroenterology

## 2021-06-14 VITALS — BP 130/86 | HR 60 | Ht 62.0 in | Wt 201.4 lb

## 2021-06-14 DIAGNOSIS — K219 Gastro-esophageal reflux disease without esophagitis: Secondary | ICD-10-CM | POA: Diagnosis not present

## 2021-06-14 DIAGNOSIS — Z860100 Personal history of colon polyps, unspecified: Secondary | ICD-10-CM

## 2021-06-14 DIAGNOSIS — K5900 Constipation, unspecified: Secondary | ICD-10-CM

## 2021-06-14 MED ORDER — NA SULFATE-K SULFATE-MG SULF 17.5-3.13-1.6 GM/177ML PO SOLN: 1.0000 | Freq: Once | ORAL | 0 refills | Status: AC

## 2021-06-14 NOTE — Patient Instructions (Addendum)
If you are age 69 or younger, your body mass index should be between 19-25. Your Body mass index is 36.84 kg/m?Marland Kitchen If this is out of the aformentioned range listed, please consider follow up with your Primary Care Provider.  ?________________________________________________________ ? ?The [facility] providers would like to encourage you to use [facility] to communicate with providers for non-urgent requests or questions.  Due to long hold times on the telephone, sending your provider a message by [facility] may be a faster and more efficient way to get a response.  Please allow 48 business hours for a response.  Please remember that this is for non-urgent requests.  ?_______________________________________________________ ? ?You have been scheduled for an endoscopy and colonoscopy. Please follow the written instructions given to you at your visit today. ?Please pick up your prep supplies at the pharmacy within the next 1-3 days. ?If you use inhalers (even only as needed), please bring them with you on the day of your procedure. ? ?Your provider has requested that you go to the basement level for lab work before leaving today. Press "B" on the elevator. The lab is located at the first door on the left as you exit the elevator. ? ?START Miralax 1 capful in 8 ounces of water/juice daily ? ?Follow up pending. ? ?Thank you for entrusting me with your care and choosing [facility]. ? ?Alonza Bogus, PA-C ?

## 2021-06-14 NOTE — Progress Notes (Signed)
Reviewed and agree with documentation and assessment and plan. K. Veena Iyan Flett , MD   

## 2021-06-14 NOTE — Progress Notes (Addendum)
? ? ? ?06/14/2021 ?Anna Francis ?[id number] ?Jul 14, 1950 ? ? ?HISTORY OF PRESENT ILLNESS: This is a 71 year old female who started here with Dr. Sharlett Iles about 20 years ago, then ended up having a colonoscopy and EGD by Dr. Lajoyce Corners in 2008 and then moved to [facility] and had a colonoscopy by Dr. Paulita Fujita in 2011.  She believes that she had a colonoscopy a second time with him since 2011, but I do not see any pathology or record from that.  Patient says that it would have been more than 5 years ago.  Nonetheless, now she is back here again, referred by her PCP, Dr. Dennard Schaumann, in order to discuss colonoscopy.  She says that she does have constipation, says that she has tried MiraLAX and Metamucil, but admits that she did not take the MiraLAX on a regular/daily basis when she used it.  She has a history of heartburn/reflux, but she says that she feels like she only gets occasional symptoms of that as of late.  Does not take anything regularly.  She just uses Tums on rare occasion as needed.  She does though admit to a lot of burping/belching and some discomfort in her epigastrium on occasion.  She says that lately when she eats, especially if she is eating more unhealthily she will feel very achy in her joints and muscles, etc. ? ?Previous GI history: ? ?Tubular adenoma and a hyperplastic colon polyp.--2011 ? ?Pathology from 07/2006 EGD and colonoscopy: ? ?1. ESOPHAGUS, DISTAL AT GE JUNCTION, BIOPSIES: BENIGN  ? GASTROESOPHAGEAL JUNCTION MUCOSA. NO INTESTINAL METAPLASIA,  ? DYSPLASIA, OR CARCINOMA IDENTIFIED.  ? ? 2. STOMACH, POLYP OF FUNDUS: BENIGN FUNDIC GLAND POLYP AND  ? FRAGMENTS OF BENIGN GASTROESOPHAGEAL JUNCTION MUCOSA WITH MILD  ? CHRONIC INFLAMMATION. NO HELICOBACTER PYLORI, DYSPLASIA, OR  ? CARCINOMA IDENTIFIED.  ? ? 3. COLON, POLYP AT 25 CM: TUBULAR ADENOMA. NO HIGH GRADE  ? DYSPLASIA OR MALIGNANCY IDENTIFIED.  ? ? ?Colonoscopy June 2003 was normal.  EGD May 2003 with GERD, reflux esophagitis, and hiatal  hernia. ? ? ?Past Medical History:  ?Diagnosis Date  ? Acid reflux   ? Angio-edema   ? Asthma   ? Back pain   ? Constipation   ? Food allergy   ? GERD (gastroesophageal reflux disease)   ? Hiatal hernia   ? Hypertension   ? IBS (irritable bowel syndrome)   ? Joint pain   ? Kidney stone   ? Kidney stones   ? Lactose intolerance   ? Pancreatitis   ? Pinched nerve   ? right hip  ? PONV (postoperative nausea and vomiting)   ? Ruptured thoracic disc   ? Sleep apnea   ? has not used CPAP in a year  ? Spinal stenosis   ? Swallowing difficulty   ? Tachycardia   ? Urinary problem in female   ? Urticaria   ? Vitamin D deficiency   ? ?Past Surgical History:  ?Procedure Laterality Date  ? ABDOMINAL HYSTERECTOMY  1998  ? BLADDER SURGERY  1998  ? CYST REMOVAL HAND  1980's  ? FOOT SURGERY Left 1980's  ? [place]  ? NASAL SINUS SURGERY    ? ROTATOR CUFF REPAIR    ? left shoulder  ? SHOULDER ARTHROSCOPY  03/02/2012  ? Procedure: ARTHROSCOPY SHOULDER;  Surgeon: Meredith Pel, MD;  Location: [facility];  Service: Orthopedics;  Laterality: Right;  Right shoulder diagnostic operative arthroscopy, rotator interval release manipulation  ? ?  reports that she has never smoked. She has never used smokeless tobacco. She reports that she does not drink alcohol and does not use drugs. ?family history includes Alcoholism in her mother; Allergy (severe) in her father and mother; Cancer in her mother; Diabetes in her father and mother; Heart disease in her mother; Hyperlipidemia in her father; Hypertension in her father and mother; Liver disease in her mother; Obesity in her mother; Sleep apnea in her mother; Stroke in her father; Thyroid disease in her mother. ?Allergies  ?Allergen Reactions  ? Anesthetics, Amide Nausea Only  ? Encainide Nausea Only  ? Keflex [Cephalexin]   ? Latex Other (See Comments)  ?  blisters  ? Lorabid [Loracarbef] Itching and Other (See Comments)  ?  bleeding  ? Penicillins Itching  ? ? ?   ?Outpatient Encounter Medications as of 06/14/2021  ?Medication Sig  ? aspirin EC 81 MG tablet Take 81 mg by mouth daily. Swallow whole. (Patient not taking: Reported on 05/30/2021)  ? cholecalciferol (VITAMIN D3) 25 MCG (1000 UNIT) tablet Take 500 Units by mouth daily. (Patient not taking: Reported on 05/30/2021)  ? phentermine (ADIPEX-P) 37.5 MG tablet Take 1 tablet (37.5 mg total) by mouth daily before breakfast.  ? ?No facility-administered encounter medications on file as of 06/14/2021.  ? ? ?REVIEW OF SYSTEMS  : All other systems reviewed and negative except where noted in the History of Present Illness. ? ? ?PHYSICAL EXAM: ?Wt 201 lb 6.4 oz (91.4 kg)   BMI 36.84 kg/m?  ?General: Well developed white female in no acute distress ?Head: Normocephalic and atraumatic ?Eyes:  Sclerae anicteric, conjunctiva pink. ?Ears: Normal auditory acuity ?Lungs: Clear throughout to auscultation; no W/R/R. ?Heart: Regular rate and rhythm; no M/R/G. ?Abdomen: Soft, non-distended.  BS present.  Non-tender. ?Musculoskeletal: Symmetrical with no gross deformities  ?Skin: No lesions on visible extremities ?Extremities: No edema  ?Neurological: Alert oriented x 4, grossly non-focal ?Psychological:  Alert and cooperative. Normal mood and affect ? ?ASSESSMENT AND PLAN: ?*Personal history of colon polyps: Last colonoscopy we have on record is 2011, but she thinks that she had another one by Dr. Paulita Fujita since then, although likely over 5 years ago.  We will plan for colonoscopy with Dr. Silverio Decamp.  We will have her sign for her previous colonoscopy records from [place]. ?*Constipation: We will start MiraLAX, which she needs to use on a daily basis. ?*History of GERD: Had a hiatal hernia, reflux, esophagitis on endoscopy in 2003.  Has not been on any medication as of late.  Reports only occasional symptoms, but does complain of some belching and occasional discomfort in her epigastrium.  We will plan for EGD as well. ? ?**The risks, benefits,  and alternatives to EGD and colonoscopy were discussed with the patient and she consents to proceed. ? ?**Addendum: Colonoscopy with Dr. Paulita Fujita January 2020 showed diverticulosis and one 5 mm hyperplastic polyp that was removed from the rectum.  Study was otherwise normal.  He noted that since no precancerous polyps were found that time she can increase to 10-year follow-up, repeat colonoscopy in 10 years.  Records sent for scanning ? ? ?CC:  Susy Frizzle, MD ? ?  ?

## 2021-06-16 LAB — TISSUE TRANSGLUTAMINASE, IGA: (tTG) Ab, IgA: <1 U/mL

## 2021-06-16 LAB — IGA: Immunoglobulin A: 239 mg/dL (ref 70–320)

## 2021-07-03 ENCOUNTER — Telehealth: Payer: Self-pay

## 2021-07-03 NOTE — Telephone Encounter (Signed)
-----   Message from Loralie Champagne, PA-C sent at 07/03/2021  3:38 PM EDT ----- ?Please let the patient know that I received her most recent colonoscopy records from Dr. Paulita Fujita.  Her last colonoscopy was January 2020.  She only had 1 small hyperplastic polyp removed.  He noted that since there was no precancerous polyps at that time then she could increase to a 10-year repeat colonoscopy follow-up.  Nonetheless, she definitely is not due at this time so please cancel her colonoscopy with Dr. Silverio Decamp.  Can leave the EGD, however.  Please enter colonoscopy recall for January 2027.  That will be 7 years, which definitely should be sufficient. ? ?Thank you, ? ?Jess  ? ?

## 2021-07-03 NOTE — Telephone Encounter (Signed)
The pt has been advised and agrees to recall colon 03/17/2025.  She will proceed with EGD as scheduled.   ?

## 2021-07-24 ENCOUNTER — Encounter: Payer: PPO | Admitting: Gastroenterology

## 2021-08-19 ENCOUNTER — Encounter: Payer: Self-pay | Admitting: Gastroenterology

## 2021-08-26 ENCOUNTER — Encounter: Payer: PPO | Admitting: Gastroenterology

## 2021-10-13 ENCOUNTER — Encounter: Payer: Self-pay | Admitting: Family Medicine

## 2021-10-14 ENCOUNTER — Other Ambulatory Visit: Payer: Self-pay | Admitting: Family Medicine

## 2021-10-14 MED ORDER — LOSARTAN POTASSIUM 100 MG PO TABS: 100.0000 mg | ORAL_TABLET | Freq: Every day | ORAL | 3 refills | Status: AC

## 2021-10-23 ENCOUNTER — Encounter (INDEPENDENT_AMBULATORY_CARE_PROVIDER_SITE_OTHER): Payer: Self-pay

## 2021-11-05 ENCOUNTER — Ambulatory Visit
Admission: RE | Admit: 2021-11-05 | Discharge: 2021-11-05 | Disposition: A | Discharge status: Home / Self Care | Payer: PPO | Source: Ambulatory Visit | Attending: Family Medicine | Admitting: Family Medicine

## 2021-11-05 DIAGNOSIS — Z780 Asymptomatic menopausal state: Secondary | ICD-10-CM | POA: Diagnosis not present

## 2021-11-20 DIAGNOSIS — D1801 Hemangioma of skin and subcutaneous tissue: Secondary | ICD-10-CM | POA: Diagnosis not present

## 2021-11-20 DIAGNOSIS — D225 Melanocytic nevi of trunk: Secondary | ICD-10-CM | POA: Diagnosis not present

## 2021-11-20 DIAGNOSIS — Z85828 Personal history of other malignant neoplasm of skin: Secondary | ICD-10-CM | POA: Diagnosis not present

## 2021-11-20 DIAGNOSIS — L905 Scar conditions and fibrosis of skin: Secondary | ICD-10-CM | POA: Diagnosis not present

## 2021-11-20 DIAGNOSIS — L821 Other seborrheic keratosis: Secondary | ICD-10-CM | POA: Diagnosis not present

## 2021-11-20 DIAGNOSIS — Z85820 Personal history of malignant melanoma of skin: Secondary | ICD-10-CM | POA: Diagnosis not present

## 2021-11-20 DIAGNOSIS — L814 Other melanin hyperpigmentation: Secondary | ICD-10-CM | POA: Diagnosis not present

## 2021-12-19 ENCOUNTER — Encounter: Payer: Self-pay | Admitting: Family Medicine

## 2021-12-20 ENCOUNTER — Other Ambulatory Visit: Payer: Self-pay | Admitting: Family Medicine

## 2021-12-20 MED ORDER — NIRMATRELVIR/RITONAVIR (PAXLOVID)TABLET: 3.0000 | ORAL_TABLET | Freq: Two times a day (BID) | ORAL | 0 refills | Status: AC

## 2021-12-24 ENCOUNTER — Ambulatory Visit (INDEPENDENT_AMBULATORY_CARE_PROVIDER_SITE_OTHER): Payer: PPO | Admitting: Family Medicine

## 2021-12-24 VITALS — BP 118/70 | HR 80 | Temp 98.6°F | Ht 62.0 in | Wt 198.0 lb

## 2021-12-24 DIAGNOSIS — J329 Chronic sinusitis, unspecified: Secondary | ICD-10-CM | POA: Diagnosis not present

## 2021-12-24 DIAGNOSIS — U071 COVID-19: Secondary | ICD-10-CM

## 2021-12-24 MED ORDER — AZITHROMYCIN 250 MG PO TABS: ORAL_TABLET | ORAL | 0 refills | Status: AC

## 2021-12-24 NOTE — Progress Notes (Signed)
Subjective:    Patient ID: Anna Francis, female    DOB: 12-05-1950, 71 y.o.   MRN: [id number]  HPI Patient contacted my office on October 5 having tested positive for COVID.  I called out Paxlovid in order to minimize her symptoms.  Patient's symptoms began on September 29.  Therefore she had already had symptoms for about 1 week before she started taking Paxlovid.  After 1 or 2 doses she developed a rash on her back and itching.  She stopped the Paxlovid over the weekend.  The rash has subsided.  She still has head congestion and pressure in her sinuses.  She feels like she has been battling a sinus infection for over a month.  She is really requesting something for sinus infection.  She feels that the sinus infection may have predated the COVID infection.  She denies any chest pain or shortness of breath or dyspnea on exertion Past Medical History:  Diagnosis Date   Acid reflux    Angio-edema    Asthma    Back pain    Colon polyps    Constipation    Food allergy    GERD (gastroesophageal reflux disease)    Hiatal hernia    Hypertension    IBS (irritable bowel syndrome)    Joint pain    Kidney stone    Kidney stones    Lactose intolerance    Pancreatitis    Pinched nerve    right hip   PONV (postoperative nausea and vomiting)    Ruptured thoracic disc    Sleep apnea    has not used CPAP in a year   Spinal stenosis    Swallowing difficulty    Tachycardia    Urinary problem in female    Urticaria    Vitamin D deficiency    Past Surgical History:  Procedure Laterality Date   ABDOMINAL HYSTERECTOMY  [place]   CYST REMOVAL HAND  1980's   FOOT SURGERY Left 1980's   MANDIBLE FRACTURE SURGERY  1979   NASAL SINUS SURGERY     ROTATOR CUFF REPAIR     left shoulder   SHOULDER ARTHROSCOPY  03/02/2012   Procedure: ARTHROSCOPY SHOULDER;  Surgeon: Meredith Pel, MD;  Location: [facility];  Service: Orthopedics;  Laterality: Right;  Right shoulder  diagnostic operative arthroscopy, rotator interval release manipulation   Current Outpatient Medications on File Prior to Visit  Medication Sig Dispense Refill   losartan (COZAAR) 100 MG tablet Take 1 tablet (100 mg total) by mouth daily. (Patient not taking: Reported on 12/24/2021) 90 tablet 3   nirmatrelvir/ritonavir EUA (PAXLOVID) 20 x 150 MG & 10 x '100MG'$  TABS Take 3 tablets by mouth 2 (two) times daily for 5 days. (Take nirmatrelvir 150 mg two tablets twice daily for 5 days and ritonavir 100 mg one tablet twice daily for 5 days) (Patient not taking: Reported on 12/24/2021) 30 tablet 0   No current facility-administered medications on file prior to visit.   Allergies  Allergen Reactions   Anesthetics, Amide Nausea Only   Encainide Nausea Only   Keflex [Cephalexin]    Latex Other (See Comments)    blisters   Lorabid [Loracarbef] Itching and Other (See Comments)    bleeding   Penicillins Itching   Social History   Socioeconomic History   Marital status: Widowed    Spouse name: Roselyn Reef   Number of children: 2   Years of education: Not on file  Highest education level: Not on file  Occupational History   Occupation: Clinical cytogeneticist  Tobacco Use   Smoking status: Never   Smokeless tobacco: Never  Vaping Use   Vaping Use: Never used  Substance and Sexual Activity   Alcohol use: No   Drug use: No   Sexual activity: Not Currently  Other Topics Concern   Not on file  Social History Narrative   Husband passed 01/2021. Daughter and grandsons live with patient.    1 son and 1 daughter    4 grandchildren   Social Determinants of Health   Financial Resource Strain: Low Risk  (05/24/2021)   Overall Financial Resource Strain (CARDIA)    Difficulty of Paying Living Expenses: Not hard at all  Food Insecurity: No Food Insecurity (05/24/2021)   Hunger Vital Sign    Worried About Running Out of Food in the Last Year: Never true    Ran Out of Food in the Last Year: Never true   Transportation Needs: No Transportation Needs (05/24/2021)   PRAPARE - Hydrologist (Medical): No    Lack of Transportation (Non-Medical): No  Physical Activity: Sufficiently Active (05/24/2021)   Exercise Vital Sign    Days of Exercise per Week: 5 days    Minutes of Exercise per Session: 40 min  Stress: No Stress Concern Present (05/24/2021)   [place]    Feeling of Stress : Only a little  Social Connections: Moderately Integrated (05/24/2021)   Social Connection and Isolation Panel [NHANES]    Frequency of Communication with Friends and Family: More than three times a week    Frequency of Social Gatherings with Friends and Family: More than three times a week    Attends Religious Services: More than 4 times per year    Active Member of [employer] or Organizations: Yes    Attends Archivist Meetings: More than 4 times per year    Marital Status: Widowed  Intimate Partner Violence: Not At Risk (05/24/2021)   Humiliation, Afraid, Rape, and Kick questionnaire    Fear of Current or Ex-Partner: No    Emotionally Abused: No    Physically Abused: No    Sexually Abused: No   Family History  Problem Relation Age of Onset   Diabetes Mother    Hypertension Mother    Heart disease Mother    Thyroid disease Mother    Cancer Mother    Liver disease Mother    Sleep apnea Mother    Alcoholism Mother    Obesity Mother    Allergy (severe) Mother        latex, antibiotics   Diabetes Father    Hypertension Father    Hyperlipidemia Father    Stroke Father    Allergy (severe) Father        foods and medications   Colon polyps Neg Hx    Colon cancer Neg Hx       Review of Systems     Objective:   Physical Exam Vitals reviewed.  Constitutional:      General: She is not in acute distress.    Appearance: Normal appearance. She is obese. She is not ill-appearing, toxic-appearing or  diaphoretic.  HENT:     Head: Normocephalic and atraumatic.     Right Ear: Tympanic membrane and ear canal normal.     Left Ear: Tympanic membrane and ear canal normal.     Nose: Congestion  and rhinorrhea present.     Right Turbinates: Enlarged and swollen.     Left Turbinates: Enlarged and swollen.     Right Sinus: Maxillary sinus tenderness present.     Left Sinus: Maxillary sinus tenderness present.  Cardiovascular:     Rate and Rhythm: Normal rate and regular rhythm.     Pulses: Normal pulses.     Heart sounds: Normal heart sounds. No murmur heard.    No friction rub. No gallop.  Pulmonary:     Effort: Pulmonary effort is normal. No respiratory distress.     Breath sounds: Normal breath sounds. No stridor. No wheezing, rhonchi or rales.  Musculoskeletal:     Right lower leg: No edema.     Left lower leg: No edema.  Lymphadenopathy:     Cervical: No cervical adenopathy.  Neurological:     Mental Status: She is alert.  Psychiatric:        Mood and Affect: Mood normal.        Behavior: Behavior normal.        Thought Content: Thought content normal.        Judgment: Judgment normal.          Assessment & Plan:   COVID-19  Rhinosinusitis I explained to the patient and she is far outside of therapeutic window for molnupiravir.  She is already starting to improve.  She had an allergic reaction the Paxlovid.  Therefore I recommended tincture of time as she is already on day 11 and likely will improve over the next 2 to 3 days ongoing.  She would like to try something for sinus infection.  I explained its most likely due to the COVID but I am willing to give her a Z-Pak to see if this will help with some of the sinusitis in case she does have bacterial sinusitis

## 2021-12-30 ENCOUNTER — Telehealth: Payer: Self-pay

## 2021-12-30 ENCOUNTER — Other Ambulatory Visit: Payer: Self-pay | Admitting: Family Medicine

## 2021-12-30 MED ORDER — LEVOFLOXACIN 500 MG PO TABS: 500.0000 mg | ORAL_TABLET | Freq: Every day | ORAL | 0 refills | Status: AC

## 2021-12-30 NOTE — Telephone Encounter (Signed)
Pt called and states that the Z-Pack rx she was given last week, is not helping her congestion. Pt asks if another medication could be sent in? Pt states she is leaving for a trip on Friday. Thank you.

## 2022-03-27 DIAGNOSIS — H15102 Unspecified episcleritis, left eye: Secondary | ICD-10-CM | POA: Diagnosis not present

## 2022-03-27 DIAGNOSIS — H1132 Conjunctival hemorrhage, left eye: Secondary | ICD-10-CM | POA: Diagnosis not present

## 2022-03-31 DIAGNOSIS — H25812 Combined forms of age-related cataract, left eye: Secondary | ICD-10-CM | POA: Diagnosis not present

## 2022-03-31 DIAGNOSIS — H1132 Conjunctival hemorrhage, left eye: Secondary | ICD-10-CM | POA: Diagnosis not present

## 2022-03-31 DIAGNOSIS — H2000 Unspecified acute and subacute iridocyclitis: Secondary | ICD-10-CM | POA: Diagnosis not present

## 2022-04-25 DIAGNOSIS — H2513 Age-related nuclear cataract, bilateral: Secondary | ICD-10-CM | POA: Diagnosis not present

## 2022-04-25 DIAGNOSIS — H5203 Hypermetropia, bilateral: Secondary | ICD-10-CM | POA: Diagnosis not present

## 2022-05-21 ENCOUNTER — Other Ambulatory Visit: Payer: Self-pay | Admitting: Adult Health Nurse Practitioner

## 2022-05-21 DIAGNOSIS — Z1231 Encounter for screening mammogram for malignant neoplasm of breast: Secondary | ICD-10-CM

## 2022-06-02 DIAGNOSIS — M25561 Pain in right knee: Secondary | ICD-10-CM | POA: Diagnosis not present

## 2022-06-02 DIAGNOSIS — I10 Essential (primary) hypertension: Secondary | ICD-10-CM | POA: Diagnosis not present

## 2022-06-02 DIAGNOSIS — E782 Mixed hyperlipidemia: Secondary | ICD-10-CM | POA: Diagnosis not present

## 2022-06-02 DIAGNOSIS — R7301 Impaired fasting glucose: Secondary | ICD-10-CM | POA: Diagnosis not present

## 2022-06-02 DIAGNOSIS — G4733 Obstructive sleep apnea (adult) (pediatric): Secondary | ICD-10-CM | POA: Diagnosis not present

## 2022-06-02 DIAGNOSIS — E559 Vitamin D deficiency, unspecified: Secondary | ICD-10-CM | POA: Diagnosis not present

## 2022-06-02 DIAGNOSIS — M79671 Pain in right foot: Secondary | ICD-10-CM | POA: Diagnosis not present

## 2022-06-02 DIAGNOSIS — Z0000 Encounter for general adult medical examination without abnormal findings: Secondary | ICD-10-CM | POA: Diagnosis not present

## 2022-06-02 DIAGNOSIS — Z86010 Personal history of colonic polyps: Secondary | ICD-10-CM | POA: Diagnosis not present

## 2022-06-09 DIAGNOSIS — M1711 Unilateral primary osteoarthritis, right knee: Secondary | ICD-10-CM | POA: Diagnosis not present

## 2022-06-09 DIAGNOSIS — M19071 Primary osteoarthritis, right ankle and foot: Secondary | ICD-10-CM | POA: Diagnosis not present

## 2022-06-09 DIAGNOSIS — M7731 Calcaneal spur, right foot: Secondary | ICD-10-CM | POA: Diagnosis not present

## 2022-06-09 DIAGNOSIS — M2341 Loose body in knee, right knee: Secondary | ICD-10-CM | POA: Diagnosis not present

## 2022-06-17 DIAGNOSIS — M25571 Pain in right ankle and joints of right foot: Secondary | ICD-10-CM | POA: Diagnosis not present

## 2022-06-17 DIAGNOSIS — M25561 Pain in right knee: Secondary | ICD-10-CM | POA: Diagnosis not present

## 2022-06-17 DIAGNOSIS — M25551 Pain in right hip: Secondary | ICD-10-CM | POA: Diagnosis not present

## 2022-07-02 ENCOUNTER — Inpatient Hospital Stay: Admission: RE | Admit: 2022-07-02 | Payer: PPO | Source: Ambulatory Visit

## 2022-07-11 DIAGNOSIS — J019 Acute sinusitis, unspecified: Secondary | ICD-10-CM | POA: Diagnosis not present

## 2022-07-11 DIAGNOSIS — B9689 Other specified bacterial agents as the cause of diseases classified elsewhere: Secondary | ICD-10-CM | POA: Diagnosis not present

## 2022-07-25 DIAGNOSIS — J0101 Acute recurrent maxillary sinusitis: Secondary | ICD-10-CM | POA: Diagnosis not present

## 2022-07-30 ENCOUNTER — Telehealth: Payer: Self-pay | Admitting: Family Medicine

## 2022-07-30 NOTE — Telephone Encounter (Signed)
Patient has transferred to a new PCP to be closer to her daughter- per cancellation reason for appt on 05/30/2022.  Patient transferred to Roe Rutherford, NP  [facility]  Thank you,  [facility] Support [facility] Direct dial  [phone]

## 2022-08-25 DIAGNOSIS — H2513 Age-related nuclear cataract, bilateral: Secondary | ICD-10-CM | POA: Diagnosis not present

## 2022-09-15 ENCOUNTER — Ambulatory Visit
Admission: RE | Admit: 2022-09-15 | Discharge: 2022-09-15 | Disposition: A | Discharge status: Home / Self Care | Payer: PPO | Source: Ambulatory Visit | Attending: Adult Health Nurse Practitioner | Admitting: Adult Health Nurse Practitioner

## 2022-09-15 DIAGNOSIS — Z1231 Encounter for screening mammogram for malignant neoplasm of breast: Secondary | ICD-10-CM | POA: Diagnosis not present

## 2022-10-01 DIAGNOSIS — H25812 Combined forms of age-related cataract, left eye: Secondary | ICD-10-CM | POA: Diagnosis not present

## 2022-10-01 DIAGNOSIS — H2512 Age-related nuclear cataract, left eye: Secondary | ICD-10-CM | POA: Diagnosis not present

## 2022-10-22 DIAGNOSIS — H21561 Pupillary abnormality, right eye: Secondary | ICD-10-CM | POA: Diagnosis not present

## 2022-10-22 DIAGNOSIS — H2511 Age-related nuclear cataract, right eye: Secondary | ICD-10-CM | POA: Diagnosis not present

## 2022-10-22 DIAGNOSIS — H25811 Combined forms of age-related cataract, right eye: Secondary | ICD-10-CM | POA: Diagnosis not present

## 2022-11-24 DIAGNOSIS — Z85828 Personal history of other malignant neoplasm of skin: Secondary | ICD-10-CM | POA: Diagnosis not present

## 2022-11-24 DIAGNOSIS — D485 Neoplasm of uncertain behavior of skin: Secondary | ICD-10-CM | POA: Diagnosis not present

## 2022-11-24 DIAGNOSIS — L080 Pyoderma: Secondary | ICD-10-CM | POA: Diagnosis not present

## 2022-11-24 DIAGNOSIS — D225 Melanocytic nevi of trunk: Secondary | ICD-10-CM | POA: Diagnosis not present

## 2022-11-24 DIAGNOSIS — L821 Other seborrheic keratosis: Secondary | ICD-10-CM | POA: Diagnosis not present

## 2022-11-24 DIAGNOSIS — L814 Other melanin hyperpigmentation: Secondary | ICD-10-CM | POA: Diagnosis not present

## 2022-12-10 ENCOUNTER — Ambulatory Visit: Payer: PPO | Admitting: Podiatry

## 2022-12-11 DIAGNOSIS — M7661 Achilles tendinitis, right leg: Secondary | ICD-10-CM | POA: Diagnosis not present

## 2022-12-11 DIAGNOSIS — M19071 Primary osteoarthritis, right ankle and foot: Secondary | ICD-10-CM | POA: Diagnosis not present

## 2022-12-25 DIAGNOSIS — Z961 Presence of intraocular lens: Secondary | ICD-10-CM | POA: Diagnosis not present

## 2023-01-05 ENCOUNTER — Ambulatory Visit
Admission: EM | Admit: 2023-01-05 | Discharge: 2023-01-05 | Disposition: A | Discharge status: Home / Self Care | Payer: PPO | Attending: Nurse Practitioner | Admitting: Nurse Practitioner

## 2023-01-05 DIAGNOSIS — Z87440 Personal history of urinary (tract) infections: Secondary | ICD-10-CM | POA: Diagnosis not present

## 2023-01-05 DIAGNOSIS — R399 Unspecified symptoms and signs involving the genitourinary system: Secondary | ICD-10-CM | POA: Diagnosis not present

## 2023-01-05 LAB — POCT URINALYSIS DIP (MANUAL ENTRY)
Glucose, UA: NEGATIVE mg/dL
Leukocytes, UA: NEGATIVE
Nitrite, UA: NEGATIVE
Protein Ur, POC: NEGATIVE mg/dL
Spec Grav, UA: 1.025 (ref 1.010–1.025)
Urobilinogen, UA: 2 U/dL — AB
pH, UA: 6 (ref 5.0–8.0)

## 2023-01-05 MED ORDER — PHENAZOPYRIDINE HCL 100 MG PO TABS: 100.0000 mg | ORAL_TABLET | Freq: Three times a day (TID) | ORAL | 0 refills | Status: AC | PRN

## 2023-01-05 MED ORDER — SULFAMETHOXAZOLE-TRIMETHOPRIM 800-160 MG PO TABS: 1.0000 | ORAL_TABLET | Freq: Two times a day (BID) | ORAL | 0 refills | Status: AC

## 2023-01-05 NOTE — ED Triage Notes (Signed)
Pt c/o UTI symptoms, odor to urine, burning with urination, abdominal pain back pain x 1 week

## 2023-01-05 NOTE — Discharge Instructions (Addendum)
Urine culture is pending.  You will be contacted if the pending test result is negative and advised to stop the antibiotic, or if the antibiotic needs to be changed.  You will also have access to your results via MyChart. Take medication as prescribed. Make sure you are drinking at least 8-10 8 ounce glasses of water daily while symptoms persist. Develop a toileting schedule that will allow you to urinate every 2 hours. Avoid caffeine, tea, soda while symptoms persist. If you experience worsening urinary symptoms along with fever, chills, or other concerns, please go to the emergency department immediately for further evaluation. As discussed, if your urine culture is negative and you are continuing to experience symptoms, I would like for you to follow-up with urology for further evaluation. Follow-up as needed.

## 2023-01-05 NOTE — ED Provider Notes (Signed)
[facility]    CSN: [id number] Arrival date & time: 01/05/23  1348      History   Chief Complaint No chief complaint on file.   HPI Anna Francis is a 72 y.o. female.   The history is provided by the patient.   Patient presents for complaints of pain with urination, urinary frequency, urgency, low back pain, and lower abdominal pain that is been present for the past week.  Patient denies fever, chills, chest pain, abdominal pain, vomiting, diarrhea, vaginal symptoms, decreased urine stream, or flank pain.  Patient reports that she did become nauseated earlier today.  Patient reports she does have a history of recurrent UTIs and kidney stones.  She states that she was seeing urology, but has not seen them in quite some time.  Patient states that at 1 point, she was on a low dose of an antibiotic for UTI prophylaxis.  She states that she was advised to stop the medication by her PCP.  Patient states she has been taking Azo and took an old prescription of an antibiotic for about 2 days, but symptoms did not improve.  Past Medical History:  Diagnosis Date   Acid reflux    Angio-edema    Asthma    Back pain    Colon polyps    Constipation    Food allergy    GERD (gastroesophageal reflux disease)    Hiatal hernia    Hypertension    IBS (irritable bowel syndrome)    Joint pain    Kidney stone    Kidney stones    Lactose intolerance    Pancreatitis    Pinched nerve    right hip   PONV (postoperative nausea and vomiting)    Ruptured thoracic disc    Sleep apnea    has not used CPAP in a year   Spinal stenosis    Swallowing difficulty    Tachycardia    Urinary problem in female    Urticaria    Vitamin D deficiency     Patient Active Problem List   Diagnosis Date Noted   History of colonic polyps 06/14/2021   Constipation 06/14/2021   Gastroesophageal reflux disease 06/14/2021   Gross hematuria 02/22/2020   Recurrent UTI 02/22/2020   Palpitations  10/28/2019   HTN (hypertension) 10/28/2019    Past Surgical History:  Procedure Laterality Date   ABDOMINAL HYSTERECTOMY  1998   BLADDER SURGERY  1998   CYST REMOVAL HAND  1980's   FOOT SURGERY Left 1980's   MANDIBLE FRACTURE SURGERY  1979   NASAL SINUS SURGERY     ROTATOR CUFF REPAIR     left shoulder   SHOULDER ARTHROSCOPY  03/02/2012   Procedure: ARTHROSCOPY SHOULDER;  Surgeon: Cammy Copa, MD;  Location: [facility] OR;  Service: Orthopedics;  Laterality: Right;  Right shoulder diagnostic operative arthroscopy, rotator interval release manipulation    OB History     Gravida  2   Para  2   Term      Preterm      AB      Living         SAB      IAB      Ectopic      Multiple      Live Births               Home Medications    Prior to Admission medications   Medication Sig Start Date End Date Taking?  Authorizing Provider  phenazopyridine (PYRIDIUM) 100 MG tablet Take 1 tablet (100 mg total) by mouth 3 (three) times daily as needed for pain. 01/05/23  Yes Leath-Warren, Sadie Haber, NP  sulfamethoxazole-trimethoprim (BACTRIM DS) 800-160 MG tablet Take 1 tablet by mouth 2 (two) times daily for 7 days. 01/05/23 01/12/23 Yes Leath-Warren, Sadie Haber, NP  azithromycin (ZITHROMAX) 250 MG tablet 2 tabs poqday1, 1 tab poqday 2-5 12/24/21   Donita Brooks, MD  losartan (COZAAR) 100 MG tablet Take 1 tablet (100 mg total) by mouth daily. Patient not taking: Reported on 12/24/2021 10/14/21   Donita Brooks, MD    Family History Family History  Problem Relation Age of Onset   Diabetes Mother    Hypertension Mother    Heart disease Mother    Thyroid disease Mother    Cancer Mother    Liver disease Mother    Sleep apnea Mother    Alcoholism Mother    Obesity Mother    Allergy (severe) Mother        latex, antibiotics   Diabetes Father    Hypertension Father    Hyperlipidemia Father    Stroke Father    Allergy (severe) Father        foods and  medications   Colon polyps Neg Hx    Colon cancer Neg Hx     Social History Social History   Tobacco Use   Smoking status: Never   Smokeless tobacco: Never  Vaping Use   Vaping status: Never Used  Substance Use Topics   Alcohol use: No   Drug use: No     Allergies   Anesthetics, amide; Encainide; Keflex [cephalexin]; Latex; Lorabid [loracarbef]; Paxlovid [nirmatrelvir-ritonavir]; and Penicillins   Review of Systems Review of Systems Per HPI  Physical Exam Triage Vital Signs ED Triage Vitals  Encounter Vitals Group     BP 01/05/23 1547 (!) 152/84     Systolic BP Percentile --      Diastolic BP Percentile --      Pulse Rate 01/05/23 1547 74     Resp 01/05/23 1547 16     Temp 01/05/23 1547 98.2 F (36.8 C)     Temp Source 01/05/23 1547 Oral     SpO2 01/05/23 1547 97 %     Weight --      Height --      Head Circumference --      Peak Flow --      Pain Score 01/05/23 1548 5     Pain Loc --      Pain Education --      Exclude from Growth Chart --    No data found.  Updated Vital Signs BP (!) 152/84 (BP Location: Right Arm)   Pulse 74   Temp 98.2 F (36.8 C) (Oral)   Resp 16   SpO2 97%   Visual Acuity Right Eye Distance:   Left Eye Distance:   Bilateral Distance:    Right Eye Near:   Left Eye Near:    Bilateral Near:     Physical Exam Vitals and nursing note reviewed.  Constitutional:      General: She is not in acute distress.    Appearance: Normal appearance.  HENT:     Head: Normocephalic.  Eyes:     Extraocular Movements: Extraocular movements intact.     Conjunctiva/sclera: Conjunctivae normal.     Pupils: Pupils are equal, round, and reactive to light.  Cardiovascular:     Rate  and Rhythm: Normal rate and regular rhythm.     Pulses: Normal pulses.     Heart sounds: Normal heart sounds.  Pulmonary:     Effort: Pulmonary effort is normal.     Breath sounds: Normal breath sounds.  Abdominal:     General: Bowel sounds are normal.      Palpations: Abdomen is soft.     Tenderness: There is no abdominal tenderness. There is no right CVA tenderness or left CVA tenderness.  Musculoskeletal:     Cervical back: Normal range of motion.  Lymphadenopathy:     Cervical: No cervical adenopathy.  Skin:    General: Skin is warm and dry.  Neurological:     General: No focal deficit present.     Mental Status: She is alert and oriented to person, place, and time.  Psychiatric:        Mood and Affect: Mood normal.        Behavior: Behavior normal.      UC Treatments / Results  Labs (all labs ordered are listed, but only abnormal results are displayed) Labs Reviewed  POCT URINALYSIS DIP (MANUAL ENTRY) - Abnormal; Notable for the following components:      Result Value   Bilirubin, UA small (*)    Ketones, POC UA small (15) (*)    Blood, UA small (*)    Urobilinogen, UA 2.0 (*)    All other components within normal limits  URINE CULTURE    EKG   Radiology No results found.  Procedures Procedures (including critical care time)  Medications Ordered in UC Medications - No data to display  Initial Impression / Assessment and Plan / UC Course  I have reviewed the triage vital signs and the nursing notes.  Pertinent labs & imaging results that were available during my care of the patient were reviewed by me and considered in my medical decision making (see chart for details).  Urinalysis is negative for leukocytes and nitrates; however, patient has taken 2 days of Macrobid, along with Azo.  She is continuing to experience symptoms.  Given patient's continued symptoms, will start Bactrim DS 800/160 mg tablets while culture is pending, for dysuria, Pyridium 100 mg was prescribed.  Supportive care recommendations were provided and discussed with the patient to include increasing her fluids, developing a toileting schedule, and avoiding caffeine.  Patient was also given strict ER follow-up precautions.  Patient was advised to  follow-up with urology if her culture result is negative and she is continuing to experience symptoms.  Patient is in agreement with this plan of care and verbalizes understanding.  All questions were answered.  Patient stable for discharge.   Final Clinical Impressions(s) / UC Diagnoses   Final diagnoses:  UTI symptoms  History of recurrent UTI (urinary tract infection)     Discharge Instructions      Urine culture is pending.  You will be contacted if the pending test result is negative and advised to stop the antibiotic, or if the antibiotic needs to be changed.  You will also have access to your results via MyChart. Take medication as prescribed. Make sure you are drinking at least 8-10 8 ounce glasses of water daily while symptoms persist. Develop a toileting schedule that will allow you to urinate every 2 hours. Avoid caffeine, tea, soda while symptoms persist. If you experience worsening urinary symptoms along with fever, chills, or other concerns, please go to the emergency department immediately for further evaluation. As discussed, if  your urine culture is negative and you are continuing to experience symptoms, I would like for you to follow-up with urology for further evaluation. Follow-up as needed.     ED Prescriptions     Medication Sig Dispense Auth. Provider   sulfamethoxazole-trimethoprim (BACTRIM DS) 800-160 MG tablet Take 1 tablet by mouth 2 (two) times daily for 7 days. 14 tablet Leath-Warren, Sadie Haber, NP   phenazopyridine (PYRIDIUM) 100 MG tablet Take 1 tablet (100 mg total) by mouth 3 (three) times daily as needed for pain. 10 tablet Leath-Warren, Sadie Haber, NP      PDMP not reviewed this encounter.   Abran Cantor, NP 01/05/23 [phone]

## 2023-01-06 LAB — URINE CULTURE: Culture: NO GROWTH

## 2023-01-26 DIAGNOSIS — M19071 Primary osteoarthritis, right ankle and foot: Secondary | ICD-10-CM | POA: Diagnosis not present

## 2023-01-26 DIAGNOSIS — M7661 Achilles tendinitis, right leg: Secondary | ICD-10-CM | POA: Diagnosis not present

## 2023-02-04 DIAGNOSIS — M7661 Achilles tendinitis, right leg: Secondary | ICD-10-CM | POA: Diagnosis not present

## 2023-02-04 DIAGNOSIS — M19071 Primary osteoarthritis, right ankle and foot: Secondary | ICD-10-CM | POA: Diagnosis not present

## 2023-02-17 ENCOUNTER — Telehealth: Payer: Self-pay | Admitting: *Deleted

## 2023-02-17 NOTE — Patient Outreach (Signed)
  Care Coordination   02/17/2023  Name: Anna Francis MRN: [id number] DOB: 10/20/1950   Care Coordination Outreach Attempts:  An unsuccessful telephone outreach was attempted today to offer the patient information about available care coordination services. HIPAA compliant message left on voicemail, providing contact information for CSW, encouraging patient to return CSW's call at her earliest convenience.  Follow Up Plan:  Additional outreach attempts will be made to offer the patient care coordination information and services.   Encounter Outcome:  No Answer.   Care Coordination Interventions:  No, not indicated.    Danford Bad, BSW, MSW, Printmaker Social Work Case Set designer Health  [facility] Population Health Direct Dial: [phone]  Fax: [phone] Email: [email or web address]

## 2023-02-20 ENCOUNTER — Telehealth: Payer: Self-pay | Admitting: *Deleted

## 2023-02-20 NOTE — Patient Outreach (Signed)
  Care Coordination   02/20/2023  Name: KNYA ENDRIS MRN: [id number] DOB: 21-Jun-1950   Care Coordination Outreach Attempts:  A second unsuccessful outreach was attempted today to offer the patient with information about available care coordination services. HIPAA compliant messages left on voicemail providing contact information for CSW, encouraging patient to return CSW's call at her earliest convenience.  Follow Up Plan:  Additional outreach attempts will be made to offer the patient care coordination information and services.   Encounter Outcome:  No Answer.   Care Coordination Interventions:  No, not indicated.    Danford Bad, BSW, MSW, Printmaker Social Work Case Set designer Health  [facility], Population Health Direct Dial: [phone]  Fax: [phone] Email: [email or web address]: [email or web address]

## 2023-02-24 ENCOUNTER — Telehealth: Payer: Self-pay | Admitting: *Deleted

## 2023-02-24 NOTE — Patient Outreach (Signed)
  Care Coordination   02/24/2023  Name: Anna Francis MRN: [id number] DOB: November 25, 1950   Care Coordination Outreach Attempts:  A third unsuccessful outreach was attempted today to offer the patient with information about available care coordination services. HIPAA compliant message left on voicemail providing contact information for CSW, encouraging patient to return CSW's call at her earliest convenience.   Follow Up Plan:  No additional outreach attempts will be made to offer the patient care coordination information and services.   Encounter Outcome:  No Answer.   Care Coordination Interventions:  No, not indicated.    Danford Bad, BSW, MSW, Printmaker Social Work Case Set designer Health  [facility], Population Health Direct Dial: [phone]  Fax: [phone] Email: [email or web address]: [email or web address]

## 2023-03-27 DIAGNOSIS — I10 Essential (primary) hypertension: Secondary | ICD-10-CM | POA: Diagnosis not present

## 2023-03-27 DIAGNOSIS — G4733 Obstructive sleep apnea (adult) (pediatric): Secondary | ICD-10-CM | POA: Diagnosis not present

## 2023-03-27 DIAGNOSIS — B9689 Other specified bacterial agents as the cause of diseases classified elsewhere: Secondary | ICD-10-CM | POA: Diagnosis not present

## 2023-03-27 DIAGNOSIS — J329 Chronic sinusitis, unspecified: Secondary | ICD-10-CM | POA: Diagnosis not present

## 2023-03-27 DIAGNOSIS — Z1330 Encounter for screening examination for mental health and behavioral disorders, unspecified: Secondary | ICD-10-CM | POA: Diagnosis not present

## 2023-03-27 DIAGNOSIS — E782 Mixed hyperlipidemia: Secondary | ICD-10-CM | POA: Diagnosis not present

## 2023-04-10 DIAGNOSIS — R0981 Nasal congestion: Secondary | ICD-10-CM | POA: Diagnosis not present

## 2023-04-10 DIAGNOSIS — R051 Acute cough: Secondary | ICD-10-CM | POA: Diagnosis not present

## 2023-04-10 DIAGNOSIS — R030 Elevated blood-pressure reading, without diagnosis of hypertension: Secondary | ICD-10-CM | POA: Diagnosis not present

## 2023-04-10 DIAGNOSIS — R059 Cough, unspecified: Secondary | ICD-10-CM | POA: Diagnosis not present

## 2023-04-10 DIAGNOSIS — J029 Acute pharyngitis, unspecified: Secondary | ICD-10-CM | POA: Diagnosis not present

## 2023-04-10 DIAGNOSIS — R519 Headache, unspecified: Secondary | ICD-10-CM | POA: Diagnosis not present

## 2023-04-10 DIAGNOSIS — J019 Acute sinusitis, unspecified: Secondary | ICD-10-CM | POA: Diagnosis not present

## 2023-04-30 DIAGNOSIS — J209 Acute bronchitis, unspecified: Secondary | ICD-10-CM | POA: Diagnosis not present

## 2023-07-10 DIAGNOSIS — H04123 Dry eye syndrome of bilateral lacrimal glands: Secondary | ICD-10-CM | POA: Diagnosis not present

## 2023-07-10 DIAGNOSIS — Z961 Presence of intraocular lens: Secondary | ICD-10-CM | POA: Diagnosis not present

## 2023-07-17 DIAGNOSIS — J019 Acute sinusitis, unspecified: Secondary | ICD-10-CM | POA: Diagnosis not present

## 2023-07-17 DIAGNOSIS — E669 Obesity, unspecified: Secondary | ICD-10-CM | POA: Diagnosis not present

## 2023-07-17 DIAGNOSIS — R030 Elevated blood-pressure reading, without diagnosis of hypertension: Secondary | ICD-10-CM | POA: Diagnosis not present

## 2023-07-17 DIAGNOSIS — Z6835 Body mass index (BMI) 35.0-35.9, adult: Secondary | ICD-10-CM | POA: Diagnosis not present

## 2023-07-24 ENCOUNTER — Encounter (INDEPENDENT_AMBULATORY_CARE_PROVIDER_SITE_OTHER): Payer: Self-pay | Admitting: Otolaryngology

## 2023-08-07 DIAGNOSIS — M25561 Pain in right knee: Secondary | ICD-10-CM | POA: Diagnosis not present

## 2023-08-18 DIAGNOSIS — M25561 Pain in right knee: Secondary | ICD-10-CM | POA: Diagnosis not present

## 2023-08-26 ENCOUNTER — Other Ambulatory Visit: Payer: Self-pay | Admitting: Adult Health Nurse Practitioner

## 2023-08-26 DIAGNOSIS — Z1231 Encounter for screening mammogram for malignant neoplasm of breast: Secondary | ICD-10-CM

## 2023-08-28 DIAGNOSIS — M25561 Pain in right knee: Secondary | ICD-10-CM | POA: Diagnosis not present

## 2023-09-09 DIAGNOSIS — S83232A Complex tear of medial meniscus, current injury, left knee, initial encounter: Secondary | ICD-10-CM | POA: Diagnosis not present

## 2023-09-09 DIAGNOSIS — M25561 Pain in right knee: Secondary | ICD-10-CM | POA: Diagnosis not present

## 2023-09-19 DIAGNOSIS — E669 Obesity, unspecified: Secondary | ICD-10-CM | POA: Diagnosis not present

## 2023-09-19 DIAGNOSIS — S60222A Contusion of left hand, initial encounter: Secondary | ICD-10-CM | POA: Diagnosis not present

## 2023-09-19 DIAGNOSIS — R030 Elevated blood-pressure reading, without diagnosis of hypertension: Secondary | ICD-10-CM | POA: Diagnosis not present

## 2023-09-19 DIAGNOSIS — Z6834 Body mass index (BMI) 34.0-34.9, adult: Secondary | ICD-10-CM | POA: Diagnosis not present

## 2023-09-21 ENCOUNTER — Ambulatory Visit
Admission: RE | Admit: 2023-09-21 | Discharge: 2023-09-21 | Disposition: A | Discharge status: Home / Self Care | Source: Ambulatory Visit | Attending: Adult Health Nurse Practitioner

## 2023-09-21 DIAGNOSIS — Z1231 Encounter for screening mammogram for malignant neoplasm of breast: Secondary | ICD-10-CM | POA: Diagnosis not present

## 2023-09-21 DIAGNOSIS — M25542 Pain in joints of left hand: Secondary | ICD-10-CM | POA: Diagnosis not present

## 2023-10-05 DIAGNOSIS — M65961 Unspecified synovitis and tenosynovitis, right lower leg: Secondary | ICD-10-CM | POA: Diagnosis not present

## 2023-10-05 DIAGNOSIS — G8918 Other acute postprocedural pain: Secondary | ICD-10-CM | POA: Diagnosis not present

## 2023-10-05 DIAGNOSIS — M2241 Chondromalacia patellae, right knee: Secondary | ICD-10-CM | POA: Diagnosis not present

## 2023-10-05 DIAGNOSIS — Y999 Unspecified external cause status: Secondary | ICD-10-CM | POA: Diagnosis not present

## 2023-10-05 DIAGNOSIS — X58XXXA Exposure to other specified factors, initial encounter: Secondary | ICD-10-CM | POA: Diagnosis not present

## 2023-10-05 DIAGNOSIS — M794 Hypertrophy of (infrapatellar) fat pad: Secondary | ICD-10-CM | POA: Diagnosis not present

## 2023-10-05 DIAGNOSIS — S83241A Other tear of medial meniscus, current injury, right knee, initial encounter: Secondary | ICD-10-CM | POA: Diagnosis not present

## 2023-10-05 DIAGNOSIS — M94261 Chondromalacia, right knee: Secondary | ICD-10-CM | POA: Diagnosis not present

## 2023-10-05 DIAGNOSIS — S83221A Peripheral tear of medial meniscus, current injury, right knee, initial encounter: Secondary | ICD-10-CM | POA: Diagnosis not present

## 2023-10-12 DIAGNOSIS — M25561 Pain in right knee: Secondary | ICD-10-CM | POA: Diagnosis not present

## 2023-10-21 DIAGNOSIS — M25561 Pain in right knee: Secondary | ICD-10-CM | POA: Diagnosis not present

## 2023-10-27 DIAGNOSIS — M25561 Pain in right knee: Secondary | ICD-10-CM | POA: Diagnosis not present

## 2023-10-30 DIAGNOSIS — M25561 Pain in right knee: Secondary | ICD-10-CM | POA: Diagnosis not present

## 2023-11-02 DIAGNOSIS — M25561 Pain in right knee: Secondary | ICD-10-CM | POA: Diagnosis not present

## 2023-11-06 DIAGNOSIS — M25561 Pain in right knee: Secondary | ICD-10-CM | POA: Diagnosis not present

## 2023-11-09 DIAGNOSIS — M25561 Pain in right knee: Secondary | ICD-10-CM | POA: Diagnosis not present

## 2023-11-12 DIAGNOSIS — M25561 Pain in right knee: Secondary | ICD-10-CM | POA: Diagnosis not present

## 2023-12-01 DIAGNOSIS — M25561 Pain in right knee: Secondary | ICD-10-CM | POA: Diagnosis not present

## 2023-12-03 DIAGNOSIS — S60222A Contusion of left hand, initial encounter: Secondary | ICD-10-CM | POA: Diagnosis not present

## 2023-12-03 DIAGNOSIS — M720 Palmar fascial fibromatosis [Dupuytren]: Secondary | ICD-10-CM | POA: Diagnosis not present

## 2023-12-03 DIAGNOSIS — M65332 Trigger finger, left middle finger: Secondary | ICD-10-CM | POA: Diagnosis not present

## 2023-12-03 DIAGNOSIS — S60221A Contusion of right hand, initial encounter: Secondary | ICD-10-CM | POA: Diagnosis not present

## 2023-12-04 DIAGNOSIS — M25561 Pain in right knee: Secondary | ICD-10-CM | POA: Diagnosis not present

## 2023-12-08 DIAGNOSIS — M25561 Pain in right knee: Secondary | ICD-10-CM | POA: Diagnosis not present

## 2023-12-10 DIAGNOSIS — M25561 Pain in right knee: Secondary | ICD-10-CM | POA: Diagnosis not present

## 2023-12-15 DIAGNOSIS — M25561 Pain in right knee: Secondary | ICD-10-CM | POA: Diagnosis not present

## 2024-02-09 DIAGNOSIS — D1801 Hemangioma of skin and subcutaneous tissue: Secondary | ICD-10-CM | POA: Diagnosis not present

## 2024-02-09 DIAGNOSIS — L814 Other melanin hyperpigmentation: Secondary | ICD-10-CM | POA: Diagnosis not present

## 2024-02-09 DIAGNOSIS — Z85820 Personal history of malignant melanoma of skin: Secondary | ICD-10-CM | POA: Diagnosis not present

## 2024-02-09 DIAGNOSIS — Z85828 Personal history of other malignant neoplasm of skin: Secondary | ICD-10-CM | POA: Diagnosis not present

## 2024-02-09 DIAGNOSIS — D225 Melanocytic nevi of trunk: Secondary | ICD-10-CM | POA: Diagnosis not present

## 2024-02-10 DIAGNOSIS — J069 Acute upper respiratory infection, unspecified: Secondary | ICD-10-CM | POA: Diagnosis not present

## 2024-04-22 ENCOUNTER — Emergency Department (HOSPITAL_BASED_OUTPATIENT_CLINIC_OR_DEPARTMENT_OTHER)

## 2024-04-22 ENCOUNTER — Other Ambulatory Visit: Payer: Self-pay

## 2024-04-22 ENCOUNTER — Emergency Department (HOSPITAL_BASED_OUTPATIENT_CLINIC_OR_DEPARTMENT_OTHER)
Admission: EM | Admit: 2024-04-22 | Discharge: 2024-04-22 | Disposition: A | Discharge status: Home / Self Care | Source: Ambulatory Visit | Attending: Emergency Medicine | Admitting: Emergency Medicine

## 2024-04-22 ENCOUNTER — Encounter (HOSPITAL_BASED_OUTPATIENT_CLINIC_OR_DEPARTMENT_OTHER): Payer: Self-pay

## 2024-04-22 DIAGNOSIS — K5792 Diverticulitis of intestine, part unspecified, without perforation or abscess without bleeding: Secondary | ICD-10-CM

## 2024-04-22 DIAGNOSIS — N898 Other specified noninflammatory disorders of vagina: Secondary | ICD-10-CM

## 2024-04-22 DIAGNOSIS — R1024 Suprapubic pain: Secondary | ICD-10-CM

## 2024-04-22 DIAGNOSIS — N949 Unspecified condition associated with female genital organs and menstrual cycle: Secondary | ICD-10-CM

## 2024-04-22 LAB — COMPREHENSIVE METABOLIC PANEL WITH GFR
ALT: 8 U/L (ref 0–44)
AST: 15 U/L (ref 15–41)
Albumin: 4.1 g/dL (ref 3.5–5.0)
Alkaline Phosphatase: 79 U/L (ref 38–126)
Anion gap: 11 (ref 5–15)
BUN: 16 mg/dL (ref 8–23)
CO2: 26 mmol/L (ref 22–32)
Calcium: 9.6 mg/dL (ref 8.9–10.3)
Chloride: 100 mmol/L (ref 98–111)
Creatinine, Ser: 0.71 mg/dL (ref 0.44–1.00)
GFR, Estimated: >60 mL/min
Glucose, Bld: 87 mg/dL (ref 70–99)
Potassium: 3.6 mmol/L (ref 3.5–5.1)
Sodium: 138 mmol/L (ref 135–145)
Total Bilirubin: 0.4 mg/dL (ref 0.0–1.2)
Total Protein: 7 g/dL (ref 6.5–8.1)

## 2024-04-22 LAB — URINALYSIS, ROUTINE W REFLEX MICROSCOPIC
Bilirubin Urine: NEGATIVE
Glucose, UA: NEGATIVE mg/dL
Ketones, ur: NEGATIVE mg/dL
Leukocytes,Ua: NEGATIVE
Nitrite: NEGATIVE
Protein, ur: NEGATIVE mg/dL
RBC / HPF: >50 RBC/hpf (ref 0–5)
Specific Gravity, Urine: 1.006 (ref 1.005–1.030)
pH: 5.5 (ref 5.0–8.0)

## 2024-04-22 LAB — CBC
HCT: 40.5 % (ref 36.0–46.0)
Hemoglobin: 13.1 g/dL (ref 12.0–15.0)
MCH: 30.3 pg (ref 26.0–34.0)
MCHC: 32.3 g/dL (ref 30.0–36.0)
MCV: 93.5 fL (ref 80.0–100.0)
Platelets: 286 10*3/uL (ref 150–400)
RBC: 4.33 MIL/uL (ref 3.87–5.11)
RDW: 13.1 % (ref 11.5–15.5)
WBC: 9.1 10*3/uL (ref 4.0–10.5)
nRBC: 0 % (ref 0.0–0.2)

## 2024-04-22 LAB — WET PREP, GENITAL
Clue Cells Wet Prep HPF POC: NONE SEEN
Sperm: NONE SEEN
Trich, Wet Prep: NONE SEEN
WBC, Wet Prep HPF POC: >=10 — AB
Yeast Wet Prep HPF POC: NONE SEEN

## 2024-04-22 LAB — LIPASE, BLOOD: Lipase: 45 U/L (ref 11–51)

## 2024-04-22 MED ORDER — DOCUSATE SODIUM 100 MG PO CAPS: 100.0000 mg | ORAL_CAPSULE | Freq: Two times a day (BID) | ORAL | 0 refills | Status: AC

## 2024-04-22 MED ORDER — ACETAMINOPHEN 325 MG PO TABS
650.0000 mg | ORAL_TABLET | Freq: Once | ORAL | Status: AC
  Administered 2024-04-22: 650 mg via ORAL
  Filled 2024-04-22: qty 2

## 2024-04-22 MED ORDER — IOHEXOL 300 MG/ML  SOLN
100.0000 mL | Freq: Once | INTRAMUSCULAR | Status: AC | PRN
  Administered 2024-04-22: 100 mL via INTRAVENOUS

## 2024-04-22 MED ORDER — OXYCODONE-ACETAMINOPHEN 5-325 MG PO TABS: 1.0000 | ORAL_TABLET | Freq: Four times a day (QID) | ORAL | 0 refills | Status: AC | PRN

## 2024-04-22 MED ORDER — CIPROFLOXACIN HCL 500 MG PO TABS: 500.0000 mg | ORAL_TABLET | Freq: Two times a day (BID) | ORAL | 0 refills | Status: AC

## 2024-04-22 MED ORDER — METRONIDAZOLE 500 MG PO TABS: 500.0000 mg | ORAL_TABLET | Freq: Two times a day (BID) | ORAL | 0 refills | Status: AC

## 2024-04-22 NOTE — Discharge Instructions (Signed)
 You were seen today for what appears to be a developing diverticulitis as well as for vaginal discomfort and discharge with some suprapubic pain.  I am switching over your antibiotic to your ciprofloxacin  and Flagyl  which will cover a broader spectrum than your current antibiotic and as well cover the diverticulitis.  Please be sure to use the narcotic pain medication only instances of extreme pain and also be taking it with Colace to help decrease risk for bowel obstruction.  Use other forms of pain medication as a first line such as Tylenol .  Recommend continue to follow-up with [facility] your PCP as well as with your already scheduled OB/GYN appointment.  Return to the ER for new or worsening symptoms.

## 2024-04-22 NOTE — ED Notes (Addendum)
 Reviewed discharge instructions, medications, and home care with pt. Pt verbalized understanding and had no further questions. Pt exited ED without complications.

## 2024-04-22 NOTE — ED Notes (Signed)
 ED Provider at bedside.

## 2024-04-22 NOTE — ED Triage Notes (Addendum)
 Arrives POV with complaints of worsening pain with urination, abdominal discomfort, and feeling intermittent dizzy feelings. Symptoms have been ongoing for 2-3 weeks. Patient is currently on antibiotics and medication for yeast infection. Hx of kidney stones

## 2024-04-22 NOTE — ED Provider Notes (Cosign Needed)
 " [place] [facility] AT [facility] Provider Note   CSN: [id number] Arrival date & time: 04/22/24  1239     Patient presents with: Abdominal Pain and Dysuria   Anna Francis is a 74 y.o. female.  Abdominal Pain Associated symptoms: dysuria   Dysuria Associated symptoms: abdominal pain    Patient is a 74 year old female presenting ED today for concerns for longstanding history of years of dysuria secondary to recurrent UTI along with suprapubic pain and intermittent hematuria, vaginal discomfort.  With having reported increased hot tub usage over the last month and a half.  Reportedly met with PCP this past week and was provided fluconazole x 2 as well as Macrobid , still taking Macrobid  but had finished the fluconazole, noting mild improvement of symptoms but then had acutely worse symptoms today that feel similar to her previous UTIs she has had in the past.  Reports that she is currently sexually active with 1 partner.  Previous medical history of GERD, HTN, recurrent UTI, pancreatitis, asthma, nephrolithiasis.  Denies headache, chest pain, shortness of breath, current hematuria, melena, hematochezia, diarrhea, vaginal bleeding, vaginal discharge    Prior to Admission medications  Medication Sig Start Date End Date Taking? Authorizing Provider  ciprofloxacin  (CIPRO ) 500 MG tablet Take 1 tablet (500 mg total) by mouth every 12 (twelve) hours for 7 days. 04/22/24 04/29/24 Yes Beola Terrall RAMAN, PA-C  docusate sodium  (COLACE) 100 MG capsule Take 1 capsule (100 mg total) by mouth every 12 (twelve) hours. 04/22/24  Yes Gust Eugene S, PA-C  metroNIDAZOLE  (FLAGYL ) 500 MG tablet Take 1 tablet (500 mg total) by mouth 2 (two) times daily. 04/22/24  Yes Beola Terrall RAMAN, PA-C  nitrofurantoin , macrocrystal-monohydrate, (MACROBID ) 100 MG capsule Take 100 mg by mouth 2 (two) times daily. 04/19/24  Yes [provider]  oxyCODONE -acetaminophen  (PERCOCET/ROXICET) 5-325 MG  tablet Take 1 tablet by mouth every 6 (six) hours as needed for severe pain (pain score 7-10). 04/22/24  Yes Beola Terrall RAMAN, PA-C  azithromycin  (ZITHROMAX ) 250 MG tablet 2 tabs poqday1, 1 tab poqday 2-5 12/24/21   Duanne Butler ONEIDA, MD  losartan  (COZAAR ) 100 MG tablet Take 1 tablet (100 mg total) by mouth daily. Patient not taking: Reported on 12/24/2021 10/14/21   Duanne Butler ONEIDA, MD  phenazopyridine  (PYRIDIUM ) 100 MG tablet Take 1 tablet (100 mg total) by mouth 3 (three) times daily as needed for pain. 01/05/23   Leath-Warren, Etta PARAS, NP    Allergies: Anesthetics, amide; Encainide; Keflex [cephalexin]; Latex; Lorabid [loracarbef]; Paxlovid  [nirmatrelvir -ritonavir ]; and Penicillins    Review of Systems  Gastrointestinal:  Positive for abdominal pain.  Genitourinary:  Positive for dysuria and vaginal pain.  All other systems reviewed and are negative.   Updated Vital Signs BP (!) 141/83   Pulse 71   Temp 97.7 F (36.5 C)   Resp 18   Ht 5' 2 (1.575 m)   Wt 89.8 kg   SpO2 96%   BMI 36.21 kg/m   Physical Exam Vitals and nursing note reviewed.  Constitutional:      General: She is not in acute distress.    Appearance: Normal appearance. She is not ill-appearing or diaphoretic.  HENT:     Head: Normocephalic and atraumatic.  Eyes:     General: No scleral icterus.       Right eye: No discharge.        Left eye: No discharge.     Extraocular Movements: Extraocular movements intact.     Conjunctiva/sclera:  Conjunctivae normal.  Cardiovascular:     Rate and Rhythm: Normal rate and regular rhythm.     Pulses: Normal pulses.     Heart sounds: Normal heart sounds. No murmur heard.    No friction rub. No gallop.  Pulmonary:     Effort: Pulmonary effort is normal. No respiratory distress.     Breath sounds: No stridor. No wheezing, rhonchi or rales.  Chest:     Chest wall: No tenderness.  Abdominal:     General: Abdomen is flat. There is no distension.     Palpations:  Abdomen is soft.     Tenderness: There is abdominal tenderness in the suprapubic area. There is no right CVA tenderness, left CVA tenderness, guarding or rebound.  Musculoskeletal:        General: No swelling, deformity or signs of injury.     Cervical back: Normal range of motion. No rigidity.     Right lower leg: No edema.     Left lower leg: No edema.  Skin:    General: Skin is warm and dry.     Findings: No bruising, erythema or lesion.  Neurological:     General: No focal deficit present.     Mental Status: She is alert and oriented to person, place, and time. Mental status is at baseline.     Sensory: No sensory deficit.     Motor: No weakness.  Psychiatric:        Mood and Affect: Mood normal.     (all labs ordered are listed, but only abnormal results are displayed) Labs Reviewed  WET PREP, GENITAL - Abnormal; Notable for the following components:      Result Value   WBC, Wet Prep HPF POC >=10 (*)    All other components within normal limits  URINALYSIS, ROUTINE W REFLEX MICROSCOPIC - Abnormal; Notable for the following components:   APPearance HAZY (*)    Hgb urine dipstick TRACE (*)    Bacteria, UA RARE (*)    Non Squamous Epithelial 0-5 (*)    All other components within normal limits  URINE CULTURE  LIPASE, BLOOD  COMPREHENSIVE METABOLIC PANEL WITH GFR  CBC  GC/CHLAMYDIA PROBE AMP ([facility]) NOT AT [facility]    EKG: None  Radiology: CT ABDOMEN PELVIS W CONTRAST Result Date: 04/22/2024 EXAM: CT ABDOMEN AND PELVIS WITH CONTRAST 04/22/2024 04:13:19 PM TECHNIQUE: CT of the abdomen and pelvis was performed with the administration of intravenous contrast. Multiplanar reformatted images are provided for review. Automated exposure control, iterative reconstruction, and/or weight-based adjustment of the mA/kV was utilized to reduce the radiation dose to as low as reasonably achievable. COMPARISON: 04/06/2020 CLINICAL HISTORY: Recurrent/complicated urinary tract infection.  FINDINGS: LOWER CHEST: Posterior bibasilar dependent atelectasis. LIVER: The liver is unremarkable. GALLBLADDER AND BILE DUCTS: Gallbladder is unremarkable. No biliary ductal dilatation. SPLEEN: No acute abnormality. PANCREAS: No acute abnormality. ADRENAL GLANDS: No acute abnormality. KIDNEYS, URETERS AND BLADDER: No stones in the kidneys or ureters. No hydronephrosis. No perinephric or periureteral stranding. The urinary bladder is mostly decompressed. GI AND BOWEL: Stomach demonstrates no acute abnormality. There is no bowel obstruction. normal decompressed appendix. Sigmoid diverticulosis. Subtle stranding about the mid sigmoid colon. No peridiverticular abscess or pneumoperitoneum. PERITONEUM AND RETROPERITONEUM: No ascites. No free air. No free pelvic fluid. VASCULATURE: Aorta is normal in caliber. LYMPH NODES: No lymphadenopathy. REPRODUCTIVE ORGANS: Hysterectomy. BONES AND SOFT TISSUES: Multilevel thoracic osteophytosis. No acute osseous abnormality. No focal soft tissue abnormality. IMPRESSION: 1. No acute abnormality  in the abdomen or pelvis. 2. Sigmoid diverticulosis. Subtle stranding about the mid sigmoid colon, which may reflect changes of acute diverticulitis. Electronically signed by: Rogelia Myers MD 04/22/2024 04:43 PM EST RP Workstation: [id number]    Procedures   Medications Ordered in the ED  iohexol  (OMNIPAQUE ) 300 MG/ML solution 100 mL (100 mLs Intravenous Contrast Given 04/22/24 1613)  acetaminophen  (TYLENOL ) tablet 650 mg (650 mg Oral Given 04/22/24 1713)     Medical Decision Making Amount and/or Complexity of Data Reviewed Labs: ordered. Radiology: ordered.  Risk OTC drugs. Prescription drug management.   This patient is a 74 year old female who presents to the ED for concern of suprapubic pain with concerns for vaginal discomfort that has been ongoing for several months.  Noted to have had been treated with fluconazole and Macrobid .  Still taking Macrobid .  However had  some worsening symptoms and came to Emergency Department for evaluation.  On physical exam, patient is in no acute distress, afebrile, alert and orient x 4, speaking in full sentences, nontachypneic, nontachycardic.  No does have some mild suprapubic abdominal tenderness.  With exam otherwise unremarkable.  Does note to have thick white, discharge in the vaginal vault with no vaginal tenderness or lesions noted.  No other surrounding erythema or lesions noted.  No CVA tenderness.  Patient overall well-appearing.  CT scan was done and did show possible diverticulitis but otherwise no acute abnormalities.  The patient was noted to have blood and white blood cells present on urine with her current symptoms, will send home with ciprofloxacin  and Flagyl , with patient having an allergy to penicillins and Keflex, having culture the urine.  Will have routine follow-up with GI which she is already established with as well as with PCP.  And OB/GYN where she is already scheduled for to visit.  Case was discussed with attending who agrees with plan.  Patient vital signs have remained stable throughout the course of patient's time in the ED. Low suspicion for any other emergent pathology at this time. I believe this patient is safe to be discharged. Provided strict return to ER precautions. Patient expressed agreement and understanding of plan. All questions were answered.  Differential diagnoses prior to evaluation: The emergent differential diagnosis includes, but is not limited to, diverticulitis, nephrolithiasis, UTI, bowel obstruction, pyelonephritis,  STI, candidiasis,. This is not an exhaustive differential.   Past Medical History / Co-morbidities / Social History: GERD, HTN, recurrent UTI, pancreatitis, asthma, nephrolithiasis  Status post hysterectomy  Additional history: Chart reviewed. Pertinent results include:   Last seen by telemedicine on 03/27/2023 and was provided doxycycline for 10 days  for bacterial sinusitis  Lab Tests/Imaging studies: I personally interpreted labs/imaging and the pertinent results include: CBC unremarkable CMP unremarkable UA notes trace hemoglobin and rare bacteria Wet prep shows greater than 10 white blood cells. GC pending CT abdomen shows no acute abnormality does note sigmoid diverticulosis with subtle stranding to sigmoid colon which may reflect changes of acute diverticulitis  I agree with the radiologist interpretation.    Medications: I ordered medication including Tylenol , ciprofloxacin , Flagyl .  I have reviewed the patients home medicines and have made adjustments as needed.  Critical Interventions: None  Social Determinants of Health: Has good follow-up with OB/GYN  Disposition: After consideration of the diagnostic results and the patients response to treatment, I feel that the patient would benefit from discharge and treatment as above.   emergency department workup does not suggest an emergent condition requiring admission or immediate intervention beyond what  has been performed at this time. The plan is: Follow-up OB/GYN, follow-up with GI, follow-up PCP, empiric treatment for diverticulitis, culture pending of urine. The patient is safe for discharge and has been instructed to return immediately for worsening symptoms, change in symptoms or any other concerns.   Final diagnoses:  Suprapubic pain  Vaginal discomfort  Vaginal discharge  Diverticulitis    ED Discharge Orders          Ordered    metroNIDAZOLE  (FLAGYL ) 500 MG tablet  2 times daily        04/22/24 1840    ciprofloxacin  (CIPRO ) 500 MG tablet  Every 12 hours        04/22/24 1840    oxyCODONE -acetaminophen  (PERCOCET/ROXICET) 5-325 MG tablet  Every 6 hours PRN        04/22/24 1840    docusate sodium  (COLACE) 100 MG capsule  Every 12 hours        04/22/24 1840               Beola Terrall RAMAN, [place] 04/22/24 1927  "
